# Patient Record
Sex: Male | Born: 1945
Health system: Southern US, Community
[De-identification: ages and names within clinical notes are randomized; demographics above are authoritative.]

## PROBLEM LIST (undated history)

## (undated) DIAGNOSIS — I110 Hypertensive heart disease with heart failure: Secondary | ICD-10-CM

## (undated) DIAGNOSIS — E785 Hyperlipidemia, unspecified: Secondary | ICD-10-CM

## (undated) DIAGNOSIS — I0981 Rheumatic heart failure: Secondary | ICD-10-CM

## (undated) DIAGNOSIS — G4730 Sleep apnea, unspecified: Secondary | ICD-10-CM

## (undated) DIAGNOSIS — I4819 Other persistent atrial fibrillation: Secondary | ICD-10-CM

## (undated) DIAGNOSIS — M1990 Unspecified osteoarthritis, unspecified site: Secondary | ICD-10-CM

## (undated) DIAGNOSIS — K56609 Unspecified intestinal obstruction, unspecified as to partial versus complete obstruction: Secondary | ICD-10-CM

## (undated) DIAGNOSIS — R739 Hyperglycemia, unspecified: Secondary | ICD-10-CM

## (undated) DIAGNOSIS — G4733 Obstructive sleep apnea (adult) (pediatric): Secondary | ICD-10-CM

## (undated) DIAGNOSIS — I059 Rheumatic mitral valve disease, unspecified: Secondary | ICD-10-CM

## (undated) DIAGNOSIS — R9439 Abnormal result of other cardiovascular function study: Secondary | ICD-10-CM

## (undated) DIAGNOSIS — I10 Essential (primary) hypertension: Secondary | ICD-10-CM

## (undated) DIAGNOSIS — Z79899 Other long term (current) drug therapy: Secondary | ICD-10-CM

## (undated) DIAGNOSIS — H269 Unspecified cataract: Secondary | ICD-10-CM

## (undated) DIAGNOSIS — I2510 Atherosclerotic heart disease of native coronary artery without angina pectoris: Secondary | ICD-10-CM

## (undated) DIAGNOSIS — Z7901 Long term (current) use of anticoagulants: Secondary | ICD-10-CM

## (undated) DIAGNOSIS — K219 Gastro-esophageal reflux disease without esophagitis: Secondary | ICD-10-CM

## (undated) DIAGNOSIS — I5032 Chronic diastolic (congestive) heart failure: Secondary | ICD-10-CM

## (undated) DIAGNOSIS — Z860100 Personal history of colon polyps, unspecified: Secondary | ICD-10-CM

## (undated) DIAGNOSIS — I517 Cardiomegaly: Secondary | ICD-10-CM

## (undated) HISTORY — PX: A FLUTTER ABLATION: SHX5348

## (undated) HISTORY — DX: Essential (primary) hypertension: I10

## (undated) HISTORY — DX: Rheumatic mitral valve disease, unspecified: I05.9

## (undated) HISTORY — PX: INGUINAL HERNIA REPAIR: SUR1180

## (undated) HISTORY — DX: Rheumatic heart failure: I09.81

## (undated) HISTORY — DX: Hyperglycemia, unspecified: R73.9

## (undated) HISTORY — PX: RECTAL SURGERY: SHX760

## (undated) HISTORY — DX: Obstructive sleep apnea (adult) (pediatric): G47.33

## (undated) HISTORY — DX: Hyperlipidemia, unspecified: E78.5

## (undated) HISTORY — DX: Unspecified osteoarthritis, unspecified site: M19.90

## (undated) HISTORY — PX: PROSTATE SURGERY: SHX751

## (undated) HISTORY — DX: Personal history of colon polyps, unspecified: Z86.0100

## (undated) HISTORY — DX: Hypertensive heart disease with heart failure: I11.0

## (undated) HISTORY — DX: Gilbert syndrome: E80.4

## (undated) HISTORY — PX: KNEE SURGERY: SHX244

## (undated) HISTORY — DX: Sleep apnea, unspecified: G47.30

## (undated) HISTORY — PX: CHOLECYSTECTOMY: SHX55

## (undated) HISTORY — DX: Other long term (current) drug therapy: Z79.899

## (undated) HISTORY — DX: Gastro-esophageal reflux disease without esophagitis: K21.9

## (undated) HISTORY — DX: Cardiomegaly: I51.7

## (undated) HISTORY — DX: Other persistent atrial fibrillation: I48.19

## (undated) HISTORY — PX: CORONARY ANGIOPLASTY WITH STENT PLACEMENT: SHX49

## (undated) HISTORY — DX: Abnormal result of other cardiovascular function study: R94.39

## (undated) HISTORY — DX: Long term (current) use of anticoagulants: Z79.01

## (undated) HISTORY — DX: Unspecified cataract: H26.9

## (undated) HISTORY — PX: HERNIA REPAIR: SHX51

## (undated) HISTORY — DX: Unspecified intestinal obstruction, unspecified as to partial versus complete obstruction: K56.609

## (undated) HISTORY — PX: CATARACT EXTRACTION: SUR2

## (undated) HISTORY — DX: Atherosclerotic heart disease of native coronary artery without angina pectoris: I25.10

## (undated) HISTORY — DX: Chronic diastolic (congestive) heart failure: I50.32

## (undated) HISTORY — DX: Personal history of colonic polyps: Z86.010

---

## 1988-12-09 DIAGNOSIS — I10 Essential (primary) hypertension: Secondary | ICD-10-CM | POA: Insufficient documentation

## 1988-12-09 HISTORY — DX: Essential (primary) hypertension: I10

## 2008-12-09 DIAGNOSIS — Z1211 Encounter for screening for malignant neoplasm of colon: Secondary | ICD-10-CM

## 2008-12-09 HISTORY — DX: Encounter for screening for malignant neoplasm of colon: Z12.11

## 2009-12-09 HISTORY — PX: CORONARY ARTERY BYPASS GRAFT: SHX141

## 2010-03-29 ENCOUNTER — Ambulatory Visit: Payer: Self-pay | Admitting: Cardiothoracic Surgery

## 2010-04-10 ENCOUNTER — Ambulatory Visit: Payer: Self-pay | Admitting: Cardiothoracic Surgery

## 2010-04-18 ENCOUNTER — Ambulatory Visit: Payer: Self-pay | Admitting: Cardiothoracic Surgery

## 2010-05-16 ENCOUNTER — Ambulatory Visit: Payer: Self-pay | Admitting: Cardiothoracic Surgery

## 2010-12-09 HISTORY — PX: CARDIAC SURGERY: SHX584

## 2011-04-23 NOTE — Assessment & Plan Note (Signed)
[facility] VISIT   Jason Stark, Jason Stark  DOB:  18-Sep-1946                                        May 16, 2010  CHART #:  [id number]   Jason Stark returned today for a scheduled follow-up after coronary  bypass grafting on April 03, 2010.  Jason Stark was seen in the office  on Apr 18, 2010, with complaint of drainage from the sternal incision  and lower extremity edema.  At that time, the sternum was stable and  there was no drainage or erythema noted along the sternal incision.  He  did have 3+ edema in the left leg extending up to the knee.  There was  some erythema near the ankle and tenderness in this region as well.  He  was asked to double his Lasix dose to 40 mg once daily and also increase  his potassium to 20 mEq daily.  Since that visit, he has continued to  make progress.  He denies any chest pain other than some persistent  soreness across his anterior chest wall.  He denies any shortness of  breath.  His last prothrombin time was 2.1 seconds obtained about 2  weeks ago.  On exam today, his heart remains in an irregularly irregular  rhythm, consistent with his known history of atrial fibrillation.  His  breath sounds are clear to auscultation.  The sternal incision is  healing nicely with no obvious complications.  The sternum is stable.  The edema in the left lower extremity has diminished significantly, but  he still has some 1+ edema extending up to the level of the mid calf.   IMPRESSION:  Continued progress after coronary bypass grafting and left-  sided Maze procedure on April 03, 2010, by Dr. Tyrone Sage.  I have asked  Jason Stark to reduce his Lasix dose back to 20 mg once daily and  potassium to 10 mEq once daily as he was taking prior to the last visit.  He has scheduled follow up with Dr. Tomie China next week at the [facility]  office of [facility].  The Coumadin is being  monitored by [facility] as well.   No further follow-up is  scheduled at this office, but I asked him to contact us if we could be  of any further assistance with his care.   Tera Mater. Arvilla Market, MD   MGR/MEDQ  D:  05/16/2010  T:  05/16/2010  Job:  [id number]

## 2011-04-23 NOTE — Assessment & Plan Note (Signed)
[facility] VISIT   EMEKA, LINDNER  DOB:  Nov 25, 1946                                        Apr 18, 2010  CHART #:  [id number]   SURGEON:  Sheliah Plane, MD.   HISTORY OF PRESENT ILLNESS:  Mr. Couper returned today at our request  for follow-up of his sternal incision and lower extremity edema.  He is  status post coronary bypass grafting and left-sided Maze procedure on  April 03, 2010.  He was seen in the office on Apr 10, 2010, for  evaluation of drainage of his lower sternal incision.  At that office  visit, he was also noted to have significant edema in the left lower  extremity.  He was started on Keflex 500 mg p.o. t.i.d. along with Lasix  20 mg p.o. daily and potassium chloride 20 mEq p.o. daily.  Since last  week, he believes he has continued to make steady improvement.  He says  he has not had any further drainage from the sternal incision.  He  continues to have some swelling of his left lower extremity, but he says  this is also slowly resolving.  He had inflammation of his left leg from  just below the knee down to the ankle for a period of several days, and  he says this too is resolving.  He denies having any fever.   PHYSICAL EXAMINATION:  Today, his heart is in an irregularly irregular  rhythm consistent with his history of atrial fibrillation.  The  ventricular rate seems to be well controlled at around 80 beats per  minute.  His breath sounds are clear to auscultation and are full and  equal.  Sternal incision is intact and healing satisfactorily with no  signs of infection.  There is no drainage and no erythema.  The sternum  is stable.  The left lower extremity continues to have 3+ edema  extending up to the level of the knee.  There is no inflammation around  the left knee incision which apparently was present at his last visit.  He has mild to erythema of his anterior lower leg in an area that would  measure approximately  10 x 10 cm just above the ankle.  He has mild  tenderness in this area.   LABORATORY DATA:  Mr. Cantara prothrombin time at the last visit was  16.9 seconds with an INR of 1.6.  He has been taking Coumadin 5 mg p.o.  daily.  He has not had any contact with the cardiology office since his  discharge home, but has a scheduled appointment with Dr. Dulce Sellar at the  [facility] office of [facility] tomorrow.   IMPRESSION:  Satisfactory progress following coronary bypass grafting.  There is still significant edema in the left lower extremity and some  mild erythema which is a little worrisome.  I have asked that he  continue with the oral Keflex for another week.  I have also increased  his Lasix to 40 mg p.o. daily and continue the potassium at 20 mEq p.o.  daily.  He is given a copy of his prothrombin time from last week which  he can deliver to Dr. Dulce Sellar at his visit tomorrow.  We will follow-up  with Mr. Arquette next week to determine whether or not he needs to  continue with Lasix and antibiotics.  His prothrombin time and Coumadin  dosage will be managed by [facility].   Leary Roca, P.A.   MGR/MEDQ  D:  04/18/2010  T:  04/18/2010  Job:  [id number]

## 2011-04-23 NOTE — Assessment & Plan Note (Signed)
[facility] VISIT   Jason Stark, Jason Stark  DOB:  07/11/1946                                        Apr 10, 2010  CHART #:  [id number]   Mr. Jason Stark had undergone coronary artery bypass grafting and left-  sided maze procedure on 04/03/2010.  He was discharged home earlier this  week.  He called the office and had a small amount of drainage from his  lower sternal incision, so was instructed to come to the office.  He has  had no fever, chills.  He has been walking every day since discharge.  He has had slight edema in his ankles.   PHYSICAL EXAMINATION:  On exam today he was afebrile.  Overall looked  well.  On examination of his sternum, he had just a very small amount of  spotting of serous fluid at the very lower pole of the sternal incision.  His chest tube sites were healed well.  His lungs were clear  bilaterally.  The left lower leg from the vein harvest site and the vein  harvest site was slightly bruised.  In the vein harvest incision at the  left knee, there was some slight erythema but no drainage.   The patient was discharged home on Coumadin.  At this point he appears  to still be in atrial fibrillation.  Since discharge he has not had a  prothrombin time checked.  Today I have written him a prescription to  start on Lasix 20 mg once a day, potassium 20 mEq once a day, each for 7  days and also start Keflex 500 mg q.8 h for 7 days.  He will stop in the  cardiologist's office today and have a prothrombin time checked to  evaluate his anticoagulation.  We will plan to see him back in 1 week.  If he has any further drainage or redness around his incision that does  not appear to be improving on current therapy, he will contact us  immediately.   Sheliah Plane, MD  Electronically Signed   EG/MEDQ  D:  04/10/2010  T:  04/10/2010  Job:  [id number]

## 2014-12-26 DIAGNOSIS — N529 Male erectile dysfunction, unspecified: Secondary | ICD-10-CM | POA: Diagnosis not present

## 2014-12-26 DIAGNOSIS — N318 Other neuromuscular dysfunction of bladder: Secondary | ICD-10-CM | POA: Diagnosis not present

## 2014-12-26 DIAGNOSIS — N3090 Cystitis, unspecified without hematuria: Secondary | ICD-10-CM | POA: Diagnosis not present

## 2014-12-26 DIAGNOSIS — N400 Enlarged prostate without lower urinary tract symptoms: Secondary | ICD-10-CM | POA: Diagnosis not present

## 2014-12-26 DIAGNOSIS — E291 Testicular hypofunction: Secondary | ICD-10-CM | POA: Diagnosis not present

## 2014-12-28 DIAGNOSIS — I5032 Chronic diastolic (congestive) heart failure: Secondary | ICD-10-CM | POA: Diagnosis not present

## 2014-12-28 DIAGNOSIS — Z79899 Other long term (current) drug therapy: Secondary | ICD-10-CM | POA: Diagnosis not present

## 2014-12-28 DIAGNOSIS — I480 Paroxysmal atrial fibrillation: Secondary | ICD-10-CM | POA: Diagnosis not present

## 2014-12-28 DIAGNOSIS — Z951 Presence of aortocoronary bypass graft: Secondary | ICD-10-CM | POA: Diagnosis not present

## 2014-12-28 DIAGNOSIS — I110 Hypertensive heart disease with heart failure: Secondary | ICD-10-CM | POA: Diagnosis not present

## 2014-12-28 DIAGNOSIS — E785 Hyperlipidemia, unspecified: Secondary | ICD-10-CM | POA: Diagnosis not present

## 2015-02-10 DIAGNOSIS — G4733 Obstructive sleep apnea (adult) (pediatric): Secondary | ICD-10-CM | POA: Diagnosis not present

## 2015-02-10 DIAGNOSIS — J4520 Mild intermittent asthma, uncomplicated: Secondary | ICD-10-CM | POA: Diagnosis not present

## 2015-02-10 DIAGNOSIS — J301 Allergic rhinitis due to pollen: Secondary | ICD-10-CM | POA: Diagnosis not present

## 2015-02-24 DIAGNOSIS — M1990 Unspecified osteoarthritis, unspecified site: Secondary | ICD-10-CM | POA: Diagnosis not present

## 2015-02-24 DIAGNOSIS — Z1211 Encounter for screening for malignant neoplasm of colon: Secondary | ICD-10-CM | POA: Diagnosis not present

## 2015-02-24 DIAGNOSIS — E559 Vitamin D deficiency, unspecified: Secondary | ICD-10-CM | POA: Diagnosis not present

## 2015-02-24 DIAGNOSIS — I110 Hypertensive heart disease with heart failure: Secondary | ICD-10-CM | POA: Diagnosis not present

## 2015-02-24 DIAGNOSIS — I509 Heart failure, unspecified: Secondary | ICD-10-CM | POA: Diagnosis not present

## 2015-02-24 DIAGNOSIS — E785 Hyperlipidemia, unspecified: Secondary | ICD-10-CM | POA: Diagnosis not present

## 2015-02-24 DIAGNOSIS — K210 Gastro-esophageal reflux disease with esophagitis: Secondary | ICD-10-CM | POA: Diagnosis not present

## 2015-02-24 DIAGNOSIS — R7309 Other abnormal glucose: Secondary | ICD-10-CM | POA: Diagnosis not present

## 2015-02-24 DIAGNOSIS — Z79899 Other long term (current) drug therapy: Secondary | ICD-10-CM | POA: Diagnosis not present

## 2015-02-24 DIAGNOSIS — Z136 Encounter for screening for cardiovascular disorders: Secondary | ICD-10-CM | POA: Diagnosis not present

## 2015-02-24 DIAGNOSIS — Z0000 Encounter for general adult medical examination without abnormal findings: Secondary | ICD-10-CM | POA: Diagnosis not present

## 2015-03-06 DIAGNOSIS — Z1211 Encounter for screening for malignant neoplasm of colon: Secondary | ICD-10-CM | POA: Diagnosis not present

## 2015-03-27 DIAGNOSIS — R339 Retention of urine, unspecified: Secondary | ICD-10-CM | POA: Diagnosis not present

## 2015-03-27 DIAGNOSIS — N401 Enlarged prostate with lower urinary tract symptoms: Secondary | ICD-10-CM | POA: Diagnosis not present

## 2015-03-27 DIAGNOSIS — R351 Nocturia: Secondary | ICD-10-CM | POA: Diagnosis not present

## 2015-03-27 DIAGNOSIS — E291 Testicular hypofunction: Secondary | ICD-10-CM | POA: Diagnosis not present

## 2015-03-27 DIAGNOSIS — N318 Other neuromuscular dysfunction of bladder: Secondary | ICD-10-CM | POA: Diagnosis not present

## 2015-03-27 DIAGNOSIS — N529 Male erectile dysfunction, unspecified: Secondary | ICD-10-CM | POA: Diagnosis not present

## 2015-03-27 DIAGNOSIS — N3090 Cystitis, unspecified without hematuria: Secondary | ICD-10-CM | POA: Diagnosis not present

## 2015-06-02 DIAGNOSIS — J45909 Unspecified asthma, uncomplicated: Secondary | ICD-10-CM | POA: Diagnosis not present

## 2015-06-02 DIAGNOSIS — J309 Allergic rhinitis, unspecified: Secondary | ICD-10-CM | POA: Diagnosis not present

## 2015-06-02 DIAGNOSIS — R05 Cough: Secondary | ICD-10-CM | POA: Diagnosis not present

## 2015-06-10 DIAGNOSIS — H6123 Impacted cerumen, bilateral: Secondary | ICD-10-CM | POA: Diagnosis not present

## 2015-06-10 DIAGNOSIS — J069 Acute upper respiratory infection, unspecified: Secondary | ICD-10-CM | POA: Diagnosis not present

## 2015-06-13 DIAGNOSIS — I4821 Permanent atrial fibrillation: Secondary | ICD-10-CM

## 2015-06-13 DIAGNOSIS — Z79899 Other long term (current) drug therapy: Secondary | ICD-10-CM | POA: Insufficient documentation

## 2015-06-13 DIAGNOSIS — I110 Hypertensive heart disease with heart failure: Secondary | ICD-10-CM | POA: Insufficient documentation

## 2015-06-13 DIAGNOSIS — I2510 Atherosclerotic heart disease of native coronary artery without angina pectoris: Secondary | ICD-10-CM

## 2015-06-13 DIAGNOSIS — E785 Hyperlipidemia, unspecified: Secondary | ICD-10-CM | POA: Insufficient documentation

## 2015-06-13 DIAGNOSIS — I5032 Chronic diastolic (congestive) heart failure: Secondary | ICD-10-CM

## 2015-06-13 DIAGNOSIS — I4819 Other persistent atrial fibrillation: Secondary | ICD-10-CM

## 2015-06-13 HISTORY — DX: Hyperlipidemia, unspecified: E78.5

## 2015-06-13 HISTORY — DX: Permanent atrial fibrillation: I48.21

## 2015-06-13 HISTORY — DX: Atherosclerotic heart disease of native coronary artery without angina pectoris: I25.10

## 2015-06-13 HISTORY — DX: Other long term (current) drug therapy: Z79.899

## 2015-06-13 HISTORY — DX: Hypertensive heart disease with heart failure: I11.0

## 2015-06-13 HISTORY — DX: Other persistent atrial fibrillation: I48.19

## 2015-06-13 HISTORY — DX: Chronic diastolic (congestive) heart failure: I50.32

## 2015-06-14 DIAGNOSIS — I2510 Atherosclerotic heart disease of native coronary artery without angina pectoris: Secondary | ICD-10-CM | POA: Diagnosis not present

## 2015-06-14 DIAGNOSIS — I480 Paroxysmal atrial fibrillation: Secondary | ICD-10-CM | POA: Diagnosis not present

## 2015-06-14 DIAGNOSIS — I110 Hypertensive heart disease with heart failure: Secondary | ICD-10-CM | POA: Diagnosis not present

## 2015-06-14 DIAGNOSIS — I5032 Chronic diastolic (congestive) heart failure: Secondary | ICD-10-CM | POA: Diagnosis not present

## 2015-06-16 DIAGNOSIS — J301 Allergic rhinitis due to pollen: Secondary | ICD-10-CM | POA: Diagnosis not present

## 2015-06-16 DIAGNOSIS — J4520 Mild intermittent asthma, uncomplicated: Secondary | ICD-10-CM | POA: Diagnosis not present

## 2015-06-16 DIAGNOSIS — G4733 Obstructive sleep apnea (adult) (pediatric): Secondary | ICD-10-CM | POA: Diagnosis not present

## 2015-06-21 DIAGNOSIS — J309 Allergic rhinitis, unspecified: Secondary | ICD-10-CM | POA: Diagnosis not present

## 2015-06-21 DIAGNOSIS — J45909 Unspecified asthma, uncomplicated: Secondary | ICD-10-CM | POA: Diagnosis not present

## 2015-06-21 DIAGNOSIS — R05 Cough: Secondary | ICD-10-CM | POA: Diagnosis not present

## 2015-06-26 DIAGNOSIS — N3090 Cystitis, unspecified without hematuria: Secondary | ICD-10-CM | POA: Diagnosis not present

## 2015-06-26 DIAGNOSIS — N400 Enlarged prostate without lower urinary tract symptoms: Secondary | ICD-10-CM | POA: Diagnosis not present

## 2015-06-26 DIAGNOSIS — E291 Testicular hypofunction: Secondary | ICD-10-CM | POA: Diagnosis not present

## 2015-06-26 DIAGNOSIS — N529 Male erectile dysfunction, unspecified: Secondary | ICD-10-CM | POA: Diagnosis not present

## 2015-06-26 DIAGNOSIS — R5383 Other fatigue: Secondary | ICD-10-CM | POA: Diagnosis not present

## 2015-06-30 DIAGNOSIS — I110 Hypertensive heart disease with heart failure: Secondary | ICD-10-CM | POA: Diagnosis not present

## 2015-06-30 DIAGNOSIS — R7309 Other abnormal glucose: Secondary | ICD-10-CM | POA: Diagnosis not present

## 2015-06-30 DIAGNOSIS — M1990 Unspecified osteoarthritis, unspecified site: Secondary | ICD-10-CM | POA: Diagnosis not present

## 2015-06-30 DIAGNOSIS — K210 Gastro-esophageal reflux disease with esophagitis: Secondary | ICD-10-CM | POA: Diagnosis not present

## 2015-06-30 DIAGNOSIS — E291 Testicular hypofunction: Secondary | ICD-10-CM | POA: Diagnosis not present

## 2015-06-30 DIAGNOSIS — Z79899 Other long term (current) drug therapy: Secondary | ICD-10-CM | POA: Diagnosis not present

## 2015-06-30 DIAGNOSIS — I509 Heart failure, unspecified: Secondary | ICD-10-CM | POA: Diagnosis not present

## 2015-06-30 DIAGNOSIS — E785 Hyperlipidemia, unspecified: Secondary | ICD-10-CM | POA: Diagnosis not present

## 2015-06-30 DIAGNOSIS — E559 Vitamin D deficiency, unspecified: Secondary | ICD-10-CM | POA: Diagnosis not present

## 2015-07-12 DIAGNOSIS — E876 Hypokalemia: Secondary | ICD-10-CM | POA: Diagnosis not present

## 2015-07-12 DIAGNOSIS — J45909 Unspecified asthma, uncomplicated: Secondary | ICD-10-CM | POA: Diagnosis not present

## 2015-07-12 DIAGNOSIS — R05 Cough: Secondary | ICD-10-CM | POA: Diagnosis not present

## 2015-07-28 DIAGNOSIS — J301 Allergic rhinitis due to pollen: Secondary | ICD-10-CM | POA: Diagnosis not present

## 2015-07-28 DIAGNOSIS — J4520 Mild intermittent asthma, uncomplicated: Secondary | ICD-10-CM | POA: Diagnosis not present

## 2015-07-28 DIAGNOSIS — G4733 Obstructive sleep apnea (adult) (pediatric): Secondary | ICD-10-CM | POA: Diagnosis not present

## 2015-09-25 DIAGNOSIS — N3020 Other chronic cystitis without hematuria: Secondary | ICD-10-CM | POA: Diagnosis not present

## 2015-09-25 DIAGNOSIS — N529 Male erectile dysfunction, unspecified: Secondary | ICD-10-CM | POA: Diagnosis not present

## 2015-09-25 DIAGNOSIS — E291 Testicular hypofunction: Secondary | ICD-10-CM | POA: Diagnosis not present

## 2015-09-25 DIAGNOSIS — N401 Enlarged prostate with lower urinary tract symptoms: Secondary | ICD-10-CM | POA: Diagnosis not present

## 2015-10-04 DIAGNOSIS — H2513 Age-related nuclear cataract, bilateral: Secondary | ICD-10-CM | POA: Diagnosis not present

## 2015-10-04 DIAGNOSIS — H04129 Dry eye syndrome of unspecified lacrimal gland: Secondary | ICD-10-CM | POA: Diagnosis not present

## 2015-10-07 DIAGNOSIS — K529 Noninfective gastroenteritis and colitis, unspecified: Secondary | ICD-10-CM | POA: Diagnosis not present

## 2015-10-12 DIAGNOSIS — A084 Viral intestinal infection, unspecified: Secondary | ICD-10-CM | POA: Diagnosis not present

## 2015-10-20 DIAGNOSIS — J301 Allergic rhinitis due to pollen: Secondary | ICD-10-CM | POA: Diagnosis not present

## 2015-10-20 DIAGNOSIS — J4520 Mild intermittent asthma, uncomplicated: Secondary | ICD-10-CM | POA: Diagnosis not present

## 2015-10-20 DIAGNOSIS — G4733 Obstructive sleep apnea (adult) (pediatric): Secondary | ICD-10-CM | POA: Diagnosis not present

## 2015-11-09 DIAGNOSIS — I110 Hypertensive heart disease with heart failure: Secondary | ICD-10-CM | POA: Diagnosis not present

## 2015-11-09 DIAGNOSIS — I5032 Chronic diastolic (congestive) heart failure: Secondary | ICD-10-CM | POA: Diagnosis not present

## 2015-11-09 DIAGNOSIS — E785 Hyperlipidemia, unspecified: Secondary | ICD-10-CM | POA: Diagnosis not present

## 2015-11-09 DIAGNOSIS — R7303 Prediabetes: Secondary | ICD-10-CM | POA: Diagnosis not present

## 2015-11-09 DIAGNOSIS — Z79899 Other long term (current) drug therapy: Secondary | ICD-10-CM | POA: Diagnosis not present

## 2015-11-09 DIAGNOSIS — K210 Gastro-esophageal reflux disease with esophagitis: Secondary | ICD-10-CM | POA: Diagnosis not present

## 2015-11-09 DIAGNOSIS — M25561 Pain in right knee: Secondary | ICD-10-CM | POA: Diagnosis not present

## 2015-11-09 DIAGNOSIS — I509 Heart failure, unspecified: Secondary | ICD-10-CM | POA: Diagnosis not present

## 2015-11-09 DIAGNOSIS — E291 Testicular hypofunction: Secondary | ICD-10-CM | POA: Diagnosis not present

## 2015-11-09 DIAGNOSIS — I130 Hypertensive heart and chronic kidney disease with heart failure and stage 1 through stage 4 chronic kidney disease, or unspecified chronic kidney disease: Secondary | ICD-10-CM | POA: Diagnosis not present

## 2015-11-09 DIAGNOSIS — E559 Vitamin D deficiency, unspecified: Secondary | ICD-10-CM | POA: Diagnosis not present

## 2015-11-09 DIAGNOSIS — M25562 Pain in left knee: Secondary | ICD-10-CM | POA: Diagnosis not present

## 2015-11-09 DIAGNOSIS — N182 Chronic kidney disease, stage 2 (mild): Secondary | ICD-10-CM | POA: Diagnosis not present

## 2015-12-12 DIAGNOSIS — Z79899 Other long term (current) drug therapy: Secondary | ICD-10-CM | POA: Diagnosis not present

## 2015-12-12 DIAGNOSIS — I480 Paroxysmal atrial fibrillation: Secondary | ICD-10-CM | POA: Diagnosis not present

## 2015-12-12 DIAGNOSIS — I5032 Chronic diastolic (congestive) heart failure: Secondary | ICD-10-CM | POA: Diagnosis not present

## 2015-12-12 DIAGNOSIS — I110 Hypertensive heart disease with heart failure: Secondary | ICD-10-CM | POA: Diagnosis not present

## 2015-12-14 DIAGNOSIS — R05 Cough: Secondary | ICD-10-CM | POA: Diagnosis not present

## 2015-12-14 DIAGNOSIS — J309 Allergic rhinitis, unspecified: Secondary | ICD-10-CM | POA: Diagnosis not present

## 2015-12-19 DIAGNOSIS — R05 Cough: Secondary | ICD-10-CM | POA: Diagnosis not present

## 2015-12-19 DIAGNOSIS — J181 Lobar pneumonia, unspecified organism: Secondary | ICD-10-CM | POA: Diagnosis not present

## 2015-12-19 DIAGNOSIS — I480 Paroxysmal atrial fibrillation: Secondary | ICD-10-CM | POA: Diagnosis not present

## 2015-12-19 DIAGNOSIS — Z7901 Long term (current) use of anticoagulants: Secondary | ICD-10-CM | POA: Insufficient documentation

## 2015-12-19 HISTORY — DX: Long term (current) use of anticoagulants: Z79.01

## 2015-12-20 DIAGNOSIS — Z7901 Long term (current) use of anticoagulants: Secondary | ICD-10-CM | POA: Diagnosis not present

## 2015-12-20 DIAGNOSIS — I480 Paroxysmal atrial fibrillation: Secondary | ICD-10-CM | POA: Diagnosis not present

## 2015-12-28 DIAGNOSIS — J069 Acute upper respiratory infection, unspecified: Secondary | ICD-10-CM | POA: Diagnosis not present

## 2015-12-28 DIAGNOSIS — R0602 Shortness of breath: Secondary | ICD-10-CM | POA: Diagnosis not present

## 2015-12-28 DIAGNOSIS — R05 Cough: Secondary | ICD-10-CM | POA: Diagnosis not present

## 2016-01-01 DIAGNOSIS — E291 Testicular hypofunction: Secondary | ICD-10-CM | POA: Diagnosis not present

## 2016-01-01 DIAGNOSIS — R351 Nocturia: Secondary | ICD-10-CM | POA: Diagnosis not present

## 2016-01-01 DIAGNOSIS — N401 Enlarged prostate with lower urinary tract symptoms: Secondary | ICD-10-CM | POA: Diagnosis not present

## 2016-01-01 DIAGNOSIS — N3020 Other chronic cystitis without hematuria: Secondary | ICD-10-CM | POA: Diagnosis not present

## 2016-01-03 DIAGNOSIS — Z862 Personal history of diseases of the blood and blood-forming organs and certain disorders involving the immune mechanism: Secondary | ICD-10-CM | POA: Diagnosis not present

## 2016-01-03 DIAGNOSIS — Z9189 Other specified personal risk factors, not elsewhere classified: Secondary | ICD-10-CM | POA: Diagnosis not present

## 2016-01-03 DIAGNOSIS — R05 Cough: Secondary | ICD-10-CM | POA: Diagnosis not present

## 2016-01-03 DIAGNOSIS — Z8701 Personal history of pneumonia (recurrent): Secondary | ICD-10-CM | POA: Diagnosis not present

## 2016-01-19 DIAGNOSIS — I480 Paroxysmal atrial fibrillation: Secondary | ICD-10-CM | POA: Diagnosis not present

## 2016-01-19 DIAGNOSIS — I129 Hypertensive chronic kidney disease with stage 1 through stage 4 chronic kidney disease, or unspecified chronic kidney disease: Secondary | ICD-10-CM | POA: Diagnosis not present

## 2016-01-19 DIAGNOSIS — N182 Chronic kidney disease, stage 2 (mild): Secondary | ICD-10-CM | POA: Diagnosis not present

## 2016-01-19 DIAGNOSIS — I509 Heart failure, unspecified: Secondary | ICD-10-CM | POA: Diagnosis not present

## 2016-01-19 DIAGNOSIS — E785 Hyperlipidemia, unspecified: Secondary | ICD-10-CM | POA: Diagnosis not present

## 2016-01-19 DIAGNOSIS — Z951 Presence of aortocoronary bypass graft: Secondary | ICD-10-CM | POA: Diagnosis not present

## 2016-01-19 DIAGNOSIS — J45909 Unspecified asthma, uncomplicated: Secondary | ICD-10-CM | POA: Diagnosis not present

## 2016-01-19 DIAGNOSIS — I2510 Atherosclerotic heart disease of native coronary artery without angina pectoris: Secondary | ICD-10-CM | POA: Diagnosis not present

## 2016-01-19 DIAGNOSIS — Z87891 Personal history of nicotine dependence: Secondary | ICD-10-CM | POA: Diagnosis not present

## 2016-01-19 DIAGNOSIS — I5030 Unspecified diastolic (congestive) heart failure: Secondary | ICD-10-CM | POA: Diagnosis not present

## 2016-01-19 DIAGNOSIS — K219 Gastro-esophageal reflux disease without esophagitis: Secondary | ICD-10-CM | POA: Diagnosis not present

## 2016-01-19 DIAGNOSIS — Z79899 Other long term (current) drug therapy: Secondary | ICD-10-CM | POA: Diagnosis not present

## 2016-01-19 DIAGNOSIS — G4730 Sleep apnea, unspecified: Secondary | ICD-10-CM | POA: Diagnosis not present

## 2016-01-19 DIAGNOSIS — I4891 Unspecified atrial fibrillation: Secondary | ICD-10-CM | POA: Diagnosis not present

## 2016-01-19 DIAGNOSIS — J449 Chronic obstructive pulmonary disease, unspecified: Secondary | ICD-10-CM | POA: Diagnosis not present

## 2016-01-19 HISTORY — PX: CARDIOVERSION: SHX1299

## 2016-01-25 DIAGNOSIS — I4891 Unspecified atrial fibrillation: Secondary | ICD-10-CM | POA: Diagnosis not present

## 2016-01-29 DIAGNOSIS — R05 Cough: Secondary | ICD-10-CM | POA: Diagnosis not present

## 2016-02-07 DIAGNOSIS — R079 Chest pain, unspecified: Secondary | ICD-10-CM | POA: Diagnosis not present

## 2016-02-07 DIAGNOSIS — I480 Paroxysmal atrial fibrillation: Secondary | ICD-10-CM | POA: Diagnosis not present

## 2016-02-21 DIAGNOSIS — R079 Chest pain, unspecified: Secondary | ICD-10-CM | POA: Diagnosis not present

## 2016-02-23 DIAGNOSIS — J4520 Mild intermittent asthma, uncomplicated: Secondary | ICD-10-CM | POA: Diagnosis not present

## 2016-02-23 DIAGNOSIS — J301 Allergic rhinitis due to pollen: Secondary | ICD-10-CM | POA: Diagnosis not present

## 2016-02-23 DIAGNOSIS — G4733 Obstructive sleep apnea (adult) (pediatric): Secondary | ICD-10-CM | POA: Diagnosis not present

## 2016-02-28 DIAGNOSIS — I130 Hypertensive heart and chronic kidney disease with heart failure and stage 1 through stage 4 chronic kidney disease, or unspecified chronic kidney disease: Secondary | ICD-10-CM | POA: Diagnosis not present

## 2016-02-28 DIAGNOSIS — Z79899 Other long term (current) drug therapy: Secondary | ICD-10-CM | POA: Diagnosis not present

## 2016-02-28 DIAGNOSIS — Z6831 Body mass index (BMI) 31.0-31.9, adult: Secondary | ICD-10-CM | POA: Diagnosis not present

## 2016-02-28 DIAGNOSIS — Z1211 Encounter for screening for malignant neoplasm of colon: Secondary | ICD-10-CM | POA: Diagnosis not present

## 2016-02-28 DIAGNOSIS — E785 Hyperlipidemia, unspecified: Secondary | ICD-10-CM | POA: Diagnosis not present

## 2016-02-28 DIAGNOSIS — Z136 Encounter for screening for cardiovascular disorders: Secondary | ICD-10-CM | POA: Diagnosis not present

## 2016-02-28 DIAGNOSIS — R7303 Prediabetes: Secondary | ICD-10-CM | POA: Diagnosis not present

## 2016-02-28 DIAGNOSIS — K219 Gastro-esophageal reflux disease without esophagitis: Secondary | ICD-10-CM | POA: Diagnosis not present

## 2016-02-28 DIAGNOSIS — E669 Obesity, unspecified: Secondary | ICD-10-CM | POA: Diagnosis not present

## 2016-02-28 DIAGNOSIS — Z9181 History of falling: Secondary | ICD-10-CM | POA: Diagnosis not present

## 2016-02-28 DIAGNOSIS — N182 Chronic kidney disease, stage 2 (mild): Secondary | ICD-10-CM | POA: Diagnosis not present

## 2016-02-28 DIAGNOSIS — Z0000 Encounter for general adult medical examination without abnormal findings: Secondary | ICD-10-CM | POA: Diagnosis not present

## 2016-02-28 DIAGNOSIS — E559 Vitamin D deficiency, unspecified: Secondary | ICD-10-CM | POA: Diagnosis not present

## 2016-02-28 DIAGNOSIS — Z1389 Encounter for screening for other disorder: Secondary | ICD-10-CM | POA: Diagnosis not present

## 2016-03-05 DIAGNOSIS — R079 Chest pain, unspecified: Secondary | ICD-10-CM | POA: Diagnosis not present

## 2016-03-05 DIAGNOSIS — I259 Chronic ischemic heart disease, unspecified: Secondary | ICD-10-CM | POA: Diagnosis not present

## 2016-03-07 DIAGNOSIS — I10 Essential (primary) hypertension: Secondary | ICD-10-CM | POA: Diagnosis not present

## 2016-03-07 DIAGNOSIS — H6120 Impacted cerumen, unspecified ear: Secondary | ICD-10-CM | POA: Diagnosis not present

## 2016-03-09 HISTORY — PX: CORONARY STENT PLACEMENT: SHX1402

## 2016-03-11 DIAGNOSIS — R9439 Abnormal result of other cardiovascular function study: Secondary | ICD-10-CM | POA: Insufficient documentation

## 2016-03-11 HISTORY — DX: Abnormal result of other cardiovascular function study: R94.39

## 2016-03-13 DIAGNOSIS — I517 Cardiomegaly: Secondary | ICD-10-CM | POA: Diagnosis not present

## 2016-03-13 DIAGNOSIS — R9439 Abnormal result of other cardiovascular function study: Secondary | ICD-10-CM | POA: Diagnosis not present

## 2016-03-13 DIAGNOSIS — I110 Hypertensive heart disease with heart failure: Secondary | ICD-10-CM | POA: Diagnosis not present

## 2016-03-13 DIAGNOSIS — I480 Paroxysmal atrial fibrillation: Secondary | ICD-10-CM | POA: Diagnosis not present

## 2016-03-13 DIAGNOSIS — I25119 Atherosclerotic heart disease of native coronary artery with unspecified angina pectoris: Secondary | ICD-10-CM | POA: Diagnosis not present

## 2016-03-13 DIAGNOSIS — Z951 Presence of aortocoronary bypass graft: Secondary | ICD-10-CM | POA: Diagnosis not present

## 2016-03-13 DIAGNOSIS — I10 Essential (primary) hypertension: Secondary | ICD-10-CM | POA: Diagnosis not present

## 2016-03-13 DIAGNOSIS — I5032 Chronic diastolic (congestive) heart failure: Secondary | ICD-10-CM | POA: Diagnosis not present

## 2016-03-21 DIAGNOSIS — R9439 Abnormal result of other cardiovascular function study: Secondary | ICD-10-CM

## 2016-03-21 DIAGNOSIS — I110 Hypertensive heart disease with heart failure: Secondary | ICD-10-CM | POA: Diagnosis not present

## 2016-03-21 DIAGNOSIS — R739 Hyperglycemia, unspecified: Secondary | ICD-10-CM | POA: Diagnosis not present

## 2016-03-21 DIAGNOSIS — I25119 Atherosclerotic heart disease of native coronary artery with unspecified angina pectoris: Secondary | ICD-10-CM | POA: Diagnosis not present

## 2016-03-21 DIAGNOSIS — Z87891 Personal history of nicotine dependence: Secondary | ICD-10-CM | POA: Diagnosis not present

## 2016-03-21 DIAGNOSIS — I4510 Unspecified right bundle-branch block: Secondary | ICD-10-CM | POA: Diagnosis not present

## 2016-03-21 DIAGNOSIS — Z9049 Acquired absence of other specified parts of digestive tract: Secondary | ICD-10-CM | POA: Diagnosis not present

## 2016-03-21 DIAGNOSIS — Z79899 Other long term (current) drug therapy: Secondary | ICD-10-CM | POA: Diagnosis not present

## 2016-03-21 DIAGNOSIS — I059 Rheumatic mitral valve disease, unspecified: Secondary | ICD-10-CM | POA: Diagnosis not present

## 2016-03-21 DIAGNOSIS — Z951 Presence of aortocoronary bypass graft: Secondary | ICD-10-CM | POA: Diagnosis not present

## 2016-03-21 DIAGNOSIS — E785 Hyperlipidemia, unspecified: Secondary | ICD-10-CM | POA: Diagnosis not present

## 2016-03-21 DIAGNOSIS — I4891 Unspecified atrial fibrillation: Secondary | ICD-10-CM | POA: Diagnosis not present

## 2016-03-21 DIAGNOSIS — I5032 Chronic diastolic (congestive) heart failure: Secondary | ICD-10-CM | POA: Diagnosis not present

## 2016-03-21 DIAGNOSIS — I25118 Atherosclerotic heart disease of native coronary artery with other forms of angina pectoris: Secondary | ICD-10-CM | POA: Diagnosis not present

## 2016-03-21 DIAGNOSIS — G4733 Obstructive sleep apnea (adult) (pediatric): Secondary | ICD-10-CM | POA: Diagnosis not present

## 2016-03-21 DIAGNOSIS — I481 Persistent atrial fibrillation: Secondary | ICD-10-CM | POA: Diagnosis not present

## 2016-03-21 DIAGNOSIS — I2119 ST elevation (STEMI) myocardial infarction involving other coronary artery of inferior wall: Secondary | ICD-10-CM | POA: Diagnosis not present

## 2016-03-21 DIAGNOSIS — Z9889 Other specified postprocedural states: Secondary | ICD-10-CM | POA: Diagnosis not present

## 2016-03-21 HISTORY — DX: Abnormal result of other cardiovascular function study: R94.39

## 2016-03-22 DIAGNOSIS — I4891 Unspecified atrial fibrillation: Secondary | ICD-10-CM | POA: Diagnosis not present

## 2016-03-22 DIAGNOSIS — I25119 Atherosclerotic heart disease of native coronary artery with unspecified angina pectoris: Secondary | ICD-10-CM | POA: Diagnosis not present

## 2016-03-22 DIAGNOSIS — G4733 Obstructive sleep apnea (adult) (pediatric): Secondary | ICD-10-CM | POA: Diagnosis not present

## 2016-03-22 DIAGNOSIS — I509 Heart failure, unspecified: Secondary | ICD-10-CM | POA: Diagnosis not present

## 2016-03-22 DIAGNOSIS — I481 Persistent atrial fibrillation: Secondary | ICD-10-CM | POA: Diagnosis not present

## 2016-03-22 DIAGNOSIS — I059 Rheumatic mitral valve disease, unspecified: Secondary | ICD-10-CM | POA: Diagnosis not present

## 2016-03-22 DIAGNOSIS — I2510 Atherosclerotic heart disease of native coronary artery without angina pectoris: Secondary | ICD-10-CM | POA: Diagnosis not present

## 2016-03-22 DIAGNOSIS — I252 Old myocardial infarction: Secondary | ICD-10-CM | POA: Diagnosis not present

## 2016-03-22 DIAGNOSIS — R9430 Abnormal result of cardiovascular function study, unspecified: Secondary | ICD-10-CM | POA: Diagnosis not present

## 2016-03-22 DIAGNOSIS — I10 Essential (primary) hypertension: Secondary | ICD-10-CM | POA: Diagnosis not present

## 2016-03-22 DIAGNOSIS — I4510 Unspecified right bundle-branch block: Secondary | ICD-10-CM | POA: Diagnosis not present

## 2016-03-22 DIAGNOSIS — R9439 Abnormal result of other cardiovascular function study: Secondary | ICD-10-CM | POA: Diagnosis not present

## 2016-03-22 DIAGNOSIS — Z951 Presence of aortocoronary bypass graft: Secondary | ICD-10-CM | POA: Diagnosis not present

## 2016-03-22 DIAGNOSIS — Z955 Presence of coronary angioplasty implant and graft: Secondary | ICD-10-CM | POA: Diagnosis not present

## 2016-04-02 DIAGNOSIS — R351 Nocturia: Secondary | ICD-10-CM | POA: Diagnosis not present

## 2016-04-02 DIAGNOSIS — J449 Chronic obstructive pulmonary disease, unspecified: Secondary | ICD-10-CM | POA: Diagnosis not present

## 2016-04-02 DIAGNOSIS — N182 Chronic kidney disease, stage 2 (mild): Secondary | ICD-10-CM | POA: Diagnosis not present

## 2016-04-02 DIAGNOSIS — I130 Hypertensive heart and chronic kidney disease with heart failure and stage 1 through stage 4 chronic kidney disease, or unspecified chronic kidney disease: Secondary | ICD-10-CM | POA: Diagnosis not present

## 2016-04-02 DIAGNOSIS — N401 Enlarged prostate with lower urinary tract symptoms: Secondary | ICD-10-CM | POA: Diagnosis not present

## 2016-04-02 DIAGNOSIS — Z1389 Encounter for screening for other disorder: Secondary | ICD-10-CM | POA: Diagnosis not present

## 2016-04-02 DIAGNOSIS — E7800 Pure hypercholesterolemia, unspecified: Secondary | ICD-10-CM | POA: Diagnosis not present

## 2016-04-02 DIAGNOSIS — I25810 Atherosclerosis of coronary artery bypass graft(s) without angina pectoris: Secondary | ICD-10-CM | POA: Diagnosis not present

## 2016-04-02 DIAGNOSIS — E291 Testicular hypofunction: Secondary | ICD-10-CM | POA: Diagnosis not present

## 2016-04-02 DIAGNOSIS — N3020 Other chronic cystitis without hematuria: Secondary | ICD-10-CM | POA: Diagnosis not present

## 2016-04-02 DIAGNOSIS — I480 Paroxysmal atrial fibrillation: Secondary | ICD-10-CM | POA: Diagnosis not present

## 2016-04-02 DIAGNOSIS — Z9189 Other specified personal risk factors, not elsewhere classified: Secondary | ICD-10-CM | POA: Diagnosis not present

## 2016-04-17 DIAGNOSIS — E785 Hyperlipidemia, unspecified: Secondary | ICD-10-CM | POA: Diagnosis not present

## 2016-04-17 DIAGNOSIS — I110 Hypertensive heart disease with heart failure: Secondary | ICD-10-CM | POA: Diagnosis not present

## 2016-04-17 DIAGNOSIS — I5032 Chronic diastolic (congestive) heart failure: Secondary | ICD-10-CM | POA: Diagnosis not present

## 2016-04-17 DIAGNOSIS — R9439 Abnormal result of other cardiovascular function study: Secondary | ICD-10-CM | POA: Diagnosis not present

## 2016-04-17 DIAGNOSIS — I25119 Atherosclerotic heart disease of native coronary artery with unspecified angina pectoris: Secondary | ICD-10-CM | POA: Diagnosis not present

## 2016-04-17 DIAGNOSIS — I481 Persistent atrial fibrillation: Secondary | ICD-10-CM | POA: Diagnosis not present

## 2016-04-25 DIAGNOSIS — R195 Other fecal abnormalities: Secondary | ICD-10-CM | POA: Diagnosis not present

## 2016-05-09 DIAGNOSIS — I2510 Atherosclerotic heart disease of native coronary artery without angina pectoris: Secondary | ICD-10-CM | POA: Diagnosis not present

## 2016-05-09 DIAGNOSIS — J449 Chronic obstructive pulmonary disease, unspecified: Secondary | ICD-10-CM | POA: Diagnosis not present

## 2016-05-09 DIAGNOSIS — R195 Other fecal abnormalities: Secondary | ICD-10-CM | POA: Diagnosis not present

## 2016-05-28 DIAGNOSIS — R195 Other fecal abnormalities: Secondary | ICD-10-CM | POA: Diagnosis not present

## 2016-05-28 DIAGNOSIS — I2510 Atherosclerotic heart disease of native coronary artery without angina pectoris: Secondary | ICD-10-CM | POA: Diagnosis not present

## 2016-06-21 DIAGNOSIS — J301 Allergic rhinitis due to pollen: Secondary | ICD-10-CM | POA: Diagnosis not present

## 2016-06-21 DIAGNOSIS — J4520 Mild intermittent asthma, uncomplicated: Secondary | ICD-10-CM | POA: Diagnosis not present

## 2016-06-21 DIAGNOSIS — G4733 Obstructive sleep apnea (adult) (pediatric): Secondary | ICD-10-CM | POA: Diagnosis not present

## 2016-07-01 DIAGNOSIS — N318 Other neuromuscular dysfunction of bladder: Secondary | ICD-10-CM | POA: Diagnosis not present

## 2016-07-01 DIAGNOSIS — N401 Enlarged prostate with lower urinary tract symptoms: Secondary | ICD-10-CM | POA: Diagnosis not present

## 2016-07-01 DIAGNOSIS — N3020 Other chronic cystitis without hematuria: Secondary | ICD-10-CM | POA: Diagnosis not present

## 2016-07-01 DIAGNOSIS — E291 Testicular hypofunction: Secondary | ICD-10-CM | POA: Diagnosis not present

## 2016-07-01 DIAGNOSIS — N210 Calculus in bladder: Secondary | ICD-10-CM | POA: Diagnosis not present

## 2016-07-19 DIAGNOSIS — M1711 Unilateral primary osteoarthritis, right knee: Secondary | ICD-10-CM | POA: Diagnosis not present

## 2016-07-20 DIAGNOSIS — R066 Hiccough: Secondary | ICD-10-CM | POA: Diagnosis not present

## 2016-07-20 DIAGNOSIS — R05 Cough: Secondary | ICD-10-CM | POA: Diagnosis not present

## 2016-07-20 DIAGNOSIS — I25810 Atherosclerosis of coronary artery bypass graft(s) without angina pectoris: Secondary | ICD-10-CM | POA: Diagnosis not present

## 2016-07-24 DIAGNOSIS — R066 Hiccough: Secondary | ICD-10-CM | POA: Diagnosis not present

## 2016-09-03 DIAGNOSIS — Z87898 Personal history of other specified conditions: Secondary | ICD-10-CM | POA: Diagnosis not present

## 2016-09-03 DIAGNOSIS — N182 Chronic kidney disease, stage 2 (mild): Secondary | ICD-10-CM | POA: Diagnosis not present

## 2016-09-03 DIAGNOSIS — K219 Gastro-esophageal reflux disease without esophagitis: Secondary | ICD-10-CM | POA: Diagnosis not present

## 2016-09-03 DIAGNOSIS — E559 Vitamin D deficiency, unspecified: Secondary | ICD-10-CM | POA: Diagnosis not present

## 2016-09-03 DIAGNOSIS — I130 Hypertensive heart and chronic kidney disease with heart failure and stage 1 through stage 4 chronic kidney disease, or unspecified chronic kidney disease: Secondary | ICD-10-CM | POA: Diagnosis not present

## 2016-09-03 DIAGNOSIS — E785 Hyperlipidemia, unspecified: Secondary | ICD-10-CM | POA: Diagnosis not present

## 2016-09-03 DIAGNOSIS — R7303 Prediabetes: Secondary | ICD-10-CM | POA: Diagnosis not present

## 2016-09-03 DIAGNOSIS — I480 Paroxysmal atrial fibrillation: Secondary | ICD-10-CM | POA: Diagnosis not present

## 2016-09-03 DIAGNOSIS — J449 Chronic obstructive pulmonary disease, unspecified: Secondary | ICD-10-CM | POA: Diagnosis not present

## 2016-09-03 DIAGNOSIS — Z23 Encounter for immunization: Secondary | ICD-10-CM | POA: Diagnosis not present

## 2016-09-20 DIAGNOSIS — J301 Allergic rhinitis due to pollen: Secondary | ICD-10-CM | POA: Diagnosis not present

## 2016-09-20 DIAGNOSIS — J4520 Mild intermittent asthma, uncomplicated: Secondary | ICD-10-CM | POA: Diagnosis not present

## 2016-09-20 DIAGNOSIS — G4733 Obstructive sleep apnea (adult) (pediatric): Secondary | ICD-10-CM | POA: Diagnosis not present

## 2016-10-17 DIAGNOSIS — I110 Hypertensive heart disease with heart failure: Secondary | ICD-10-CM | POA: Diagnosis not present

## 2016-10-17 DIAGNOSIS — Z7901 Long term (current) use of anticoagulants: Secondary | ICD-10-CM | POA: Diagnosis not present

## 2016-10-17 DIAGNOSIS — I481 Persistent atrial fibrillation: Secondary | ICD-10-CM | POA: Diagnosis not present

## 2016-10-17 DIAGNOSIS — I5032 Chronic diastolic (congestive) heart failure: Secondary | ICD-10-CM | POA: Diagnosis not present

## 2016-10-17 DIAGNOSIS — I25119 Atherosclerotic heart disease of native coronary artery with unspecified angina pectoris: Secondary | ICD-10-CM | POA: Diagnosis not present

## 2016-10-17 DIAGNOSIS — E785 Hyperlipidemia, unspecified: Secondary | ICD-10-CM | POA: Diagnosis not present

## 2016-11-02 DIAGNOSIS — J209 Acute bronchitis, unspecified: Secondary | ICD-10-CM | POA: Diagnosis not present

## 2016-12-04 DIAGNOSIS — H2513 Age-related nuclear cataract, bilateral: Secondary | ICD-10-CM | POA: Diagnosis not present

## 2016-12-05 DIAGNOSIS — J111 Influenza due to unidentified influenza virus with other respiratory manifestations: Secondary | ICD-10-CM | POA: Diagnosis not present

## 2016-12-05 DIAGNOSIS — H6122 Impacted cerumen, left ear: Secondary | ICD-10-CM | POA: Diagnosis not present

## 2016-12-30 DIAGNOSIS — N401 Enlarged prostate with lower urinary tract symptoms: Secondary | ICD-10-CM | POA: Diagnosis not present

## 2016-12-30 DIAGNOSIS — E291 Testicular hypofunction: Secondary | ICD-10-CM | POA: Diagnosis not present

## 2017-01-02 DIAGNOSIS — E785 Hyperlipidemia, unspecified: Secondary | ICD-10-CM | POA: Diagnosis not present

## 2017-01-02 DIAGNOSIS — E559 Vitamin D deficiency, unspecified: Secondary | ICD-10-CM | POA: Diagnosis not present

## 2017-01-02 DIAGNOSIS — N182 Chronic kidney disease, stage 2 (mild): Secondary | ICD-10-CM | POA: Diagnosis not present

## 2017-01-02 DIAGNOSIS — K219 Gastro-esophageal reflux disease without esophagitis: Secondary | ICD-10-CM | POA: Diagnosis not present

## 2017-01-02 DIAGNOSIS — I130 Hypertensive heart and chronic kidney disease with heart failure and stage 1 through stage 4 chronic kidney disease, or unspecified chronic kidney disease: Secondary | ICD-10-CM | POA: Diagnosis not present

## 2017-01-02 DIAGNOSIS — I480 Paroxysmal atrial fibrillation: Secondary | ICD-10-CM | POA: Diagnosis not present

## 2017-01-02 DIAGNOSIS — J449 Chronic obstructive pulmonary disease, unspecified: Secondary | ICD-10-CM | POA: Diagnosis not present

## 2017-01-02 DIAGNOSIS — R7303 Prediabetes: Secondary | ICD-10-CM | POA: Diagnosis not present

## 2017-01-16 DIAGNOSIS — G4733 Obstructive sleep apnea (adult) (pediatric): Secondary | ICD-10-CM | POA: Diagnosis not present

## 2017-01-16 DIAGNOSIS — J4520 Mild intermittent asthma, uncomplicated: Secondary | ICD-10-CM | POA: Diagnosis not present

## 2017-01-16 DIAGNOSIS — J301 Allergic rhinitis due to pollen: Secondary | ICD-10-CM | POA: Diagnosis not present

## 2017-01-21 DIAGNOSIS — M1711 Unilateral primary osteoarthritis, right knee: Secondary | ICD-10-CM | POA: Diagnosis not present

## 2017-01-22 DIAGNOSIS — R066 Hiccough: Secondary | ICD-10-CM | POA: Diagnosis not present

## 2017-04-03 DIAGNOSIS — Z1211 Encounter for screening for malignant neoplasm of colon: Secondary | ICD-10-CM | POA: Diagnosis not present

## 2017-04-03 DIAGNOSIS — Z125 Encounter for screening for malignant neoplasm of prostate: Secondary | ICD-10-CM | POA: Diagnosis not present

## 2017-04-03 DIAGNOSIS — E7800 Pure hypercholesterolemia, unspecified: Secondary | ICD-10-CM | POA: Diagnosis not present

## 2017-04-03 DIAGNOSIS — Z0000 Encounter for general adult medical examination without abnormal findings: Secondary | ICD-10-CM | POA: Diagnosis not present

## 2017-04-03 DIAGNOSIS — Z6831 Body mass index (BMI) 31.0-31.9, adult: Secondary | ICD-10-CM | POA: Diagnosis not present

## 2017-04-03 DIAGNOSIS — I10 Essential (primary) hypertension: Secondary | ICD-10-CM | POA: Diagnosis not present

## 2017-04-03 DIAGNOSIS — Z79899 Other long term (current) drug therapy: Secondary | ICD-10-CM | POA: Diagnosis not present

## 2017-04-03 DIAGNOSIS — E559 Vitamin D deficiency, unspecified: Secondary | ICD-10-CM | POA: Diagnosis not present

## 2017-04-03 DIAGNOSIS — E669 Obesity, unspecified: Secondary | ICD-10-CM | POA: Diagnosis not present

## 2017-04-16 DIAGNOSIS — I110 Hypertensive heart disease with heart failure: Secondary | ICD-10-CM | POA: Diagnosis not present

## 2017-04-16 DIAGNOSIS — Z7901 Long term (current) use of anticoagulants: Secondary | ICD-10-CM | POA: Diagnosis not present

## 2017-04-16 DIAGNOSIS — E785 Hyperlipidemia, unspecified: Secondary | ICD-10-CM | POA: Diagnosis not present

## 2017-04-16 DIAGNOSIS — I25119 Atherosclerotic heart disease of native coronary artery with unspecified angina pectoris: Secondary | ICD-10-CM | POA: Diagnosis not present

## 2017-04-16 DIAGNOSIS — I481 Persistent atrial fibrillation: Secondary | ICD-10-CM | POA: Diagnosis not present

## 2017-04-16 DIAGNOSIS — I5032 Chronic diastolic (congestive) heart failure: Secondary | ICD-10-CM | POA: Diagnosis not present

## 2017-04-17 DIAGNOSIS — I5032 Chronic diastolic (congestive) heart failure: Secondary | ICD-10-CM | POA: Diagnosis not present

## 2017-04-17 DIAGNOSIS — K219 Gastro-esophageal reflux disease without esophagitis: Secondary | ICD-10-CM | POA: Diagnosis not present

## 2017-04-17 DIAGNOSIS — I130 Hypertensive heart and chronic kidney disease with heart failure and stage 1 through stage 4 chronic kidney disease, or unspecified chronic kidney disease: Secondary | ICD-10-CM | POA: Diagnosis not present

## 2017-04-17 DIAGNOSIS — E785 Hyperlipidemia, unspecified: Secondary | ICD-10-CM | POA: Diagnosis not present

## 2017-04-17 DIAGNOSIS — Z87898 Personal history of other specified conditions: Secondary | ICD-10-CM | POA: Diagnosis not present

## 2017-04-17 DIAGNOSIS — I480 Paroxysmal atrial fibrillation: Secondary | ICD-10-CM | POA: Diagnosis not present

## 2017-04-17 DIAGNOSIS — J449 Chronic obstructive pulmonary disease, unspecified: Secondary | ICD-10-CM | POA: Diagnosis not present

## 2017-04-17 DIAGNOSIS — N182 Chronic kidney disease, stage 2 (mild): Secondary | ICD-10-CM | POA: Diagnosis not present

## 2017-04-17 DIAGNOSIS — Z79899 Other long term (current) drug therapy: Secondary | ICD-10-CM | POA: Diagnosis not present

## 2017-04-17 DIAGNOSIS — R195 Other fecal abnormalities: Secondary | ICD-10-CM | POA: Diagnosis not present

## 2017-04-17 DIAGNOSIS — E669 Obesity, unspecified: Secondary | ICD-10-CM | POA: Diagnosis not present

## 2017-04-17 DIAGNOSIS — R7303 Prediabetes: Secondary | ICD-10-CM | POA: Diagnosis not present

## 2017-04-22 DIAGNOSIS — R195 Other fecal abnormalities: Secondary | ICD-10-CM | POA: Diagnosis not present

## 2017-04-25 DIAGNOSIS — J209 Acute bronchitis, unspecified: Secondary | ICD-10-CM | POA: Diagnosis not present

## 2017-05-07 DIAGNOSIS — J309 Allergic rhinitis, unspecified: Secondary | ICD-10-CM | POA: Diagnosis not present

## 2017-05-28 DIAGNOSIS — K649 Unspecified hemorrhoids: Secondary | ICD-10-CM | POA: Diagnosis not present

## 2017-05-28 DIAGNOSIS — Z720 Tobacco use: Secondary | ICD-10-CM | POA: Diagnosis not present

## 2017-05-28 DIAGNOSIS — I10 Essential (primary) hypertension: Secondary | ICD-10-CM | POA: Diagnosis not present

## 2017-05-28 DIAGNOSIS — K5730 Diverticulosis of large intestine without perforation or abscess without bleeding: Secondary | ICD-10-CM | POA: Diagnosis not present

## 2017-05-28 DIAGNOSIS — I25810 Atherosclerosis of coronary artery bypass graft(s) without angina pectoris: Secondary | ICD-10-CM | POA: Diagnosis not present

## 2017-05-28 DIAGNOSIS — Z955 Presence of coronary angioplasty implant and graft: Secondary | ICD-10-CM | POA: Diagnosis not present

## 2017-05-28 DIAGNOSIS — E669 Obesity, unspecified: Secondary | ICD-10-CM | POA: Diagnosis not present

## 2017-05-28 DIAGNOSIS — R195 Other fecal abnormalities: Secondary | ICD-10-CM | POA: Diagnosis not present

## 2017-05-28 DIAGNOSIS — K648 Other hemorrhoids: Secondary | ICD-10-CM | POA: Diagnosis not present

## 2017-05-28 DIAGNOSIS — E7800 Pure hypercholesterolemia, unspecified: Secondary | ICD-10-CM | POA: Diagnosis not present

## 2017-05-28 DIAGNOSIS — D124 Benign neoplasm of descending colon: Secondary | ICD-10-CM | POA: Diagnosis not present

## 2017-05-28 HISTORY — PX: COLONOSCOPY W/ POLYPECTOMY: SHX1380

## 2017-05-30 DIAGNOSIS — J301 Allergic rhinitis due to pollen: Secondary | ICD-10-CM | POA: Diagnosis not present

## 2017-05-30 DIAGNOSIS — J4520 Mild intermittent asthma, uncomplicated: Secondary | ICD-10-CM | POA: Diagnosis not present

## 2017-06-30 DIAGNOSIS — N401 Enlarged prostate with lower urinary tract symptoms: Secondary | ICD-10-CM | POA: Diagnosis not present

## 2017-06-30 DIAGNOSIS — E291 Testicular hypofunction: Secondary | ICD-10-CM | POA: Diagnosis not present

## 2017-06-30 DIAGNOSIS — N3020 Other chronic cystitis without hematuria: Secondary | ICD-10-CM | POA: Diagnosis not present

## 2017-06-30 DIAGNOSIS — N318 Other neuromuscular dysfunction of bladder: Secondary | ICD-10-CM | POA: Diagnosis not present

## 2017-07-23 DIAGNOSIS — B9681 Helicobacter pylori [H. pylori] as the cause of diseases classified elsewhere: Secondary | ICD-10-CM | POA: Diagnosis not present

## 2017-07-23 DIAGNOSIS — K449 Diaphragmatic hernia without obstruction or gangrene: Secondary | ICD-10-CM | POA: Diagnosis not present

## 2017-07-23 DIAGNOSIS — Z955 Presence of coronary angioplasty implant and graft: Secondary | ICD-10-CM | POA: Diagnosis not present

## 2017-07-23 DIAGNOSIS — I10 Essential (primary) hypertension: Secondary | ICD-10-CM | POA: Diagnosis not present

## 2017-07-23 DIAGNOSIS — R195 Other fecal abnormalities: Secondary | ICD-10-CM | POA: Diagnosis not present

## 2017-07-23 DIAGNOSIS — E7800 Pure hypercholesterolemia, unspecified: Secondary | ICD-10-CM | POA: Diagnosis not present

## 2017-07-23 DIAGNOSIS — F17220 Nicotine dependence, chewing tobacco, uncomplicated: Secondary | ICD-10-CM | POA: Diagnosis not present

## 2017-07-23 DIAGNOSIS — E669 Obesity, unspecified: Secondary | ICD-10-CM | POA: Diagnosis not present

## 2017-07-23 DIAGNOSIS — K219 Gastro-esophageal reflux disease without esophagitis: Secondary | ICD-10-CM | POA: Diagnosis not present

## 2017-07-23 DIAGNOSIS — K2900 Acute gastritis without bleeding: Secondary | ICD-10-CM | POA: Diagnosis not present

## 2017-07-23 DIAGNOSIS — K2970 Gastritis, unspecified, without bleeding: Secondary | ICD-10-CM | POA: Diagnosis not present

## 2017-07-23 DIAGNOSIS — I25810 Atherosclerosis of coronary artery bypass graft(s) without angina pectoris: Secondary | ICD-10-CM | POA: Diagnosis not present

## 2017-07-23 HISTORY — PX: ESOPHAGOGASTRODUODENOSCOPY: SHX1529

## 2017-08-08 ENCOUNTER — Other Ambulatory Visit: Payer: Self-pay | Admitting: Cardiology

## 2017-08-08 NOTE — Telephone Encounter (Signed)
Left message to return call to confirm if patient will be following Dr. Bettina Gavia.

## 2017-08-12 DIAGNOSIS — M1711 Unilateral primary osteoarthritis, right knee: Secondary | ICD-10-CM | POA: Diagnosis not present

## 2017-08-16 ENCOUNTER — Other Ambulatory Visit: Payer: Self-pay | Admitting: Cardiology

## 2017-08-18 MED ORDER — EDOXABAN TOSYLATE 60 MG PO TABS: 60.0000 mg | ORAL_TABLET | Freq: Every day | ORAL | 1 refills | Status: DC

## 2017-08-18 NOTE — Addendum Note (Signed)
Addended by: Kathyrn Sheriff on: 08/18/2017 08:50 AM   Modules accepted: Orders

## 2017-08-22 DIAGNOSIS — M1711 Unilateral primary osteoarthritis, right knee: Secondary | ICD-10-CM | POA: Diagnosis not present

## 2017-08-27 DIAGNOSIS — G4733 Obstructive sleep apnea (adult) (pediatric): Secondary | ICD-10-CM | POA: Insufficient documentation

## 2017-08-27 DIAGNOSIS — I517 Cardiomegaly: Secondary | ICD-10-CM

## 2017-08-27 DIAGNOSIS — M1990 Unspecified osteoarthritis, unspecified site: Secondary | ICD-10-CM

## 2017-08-27 DIAGNOSIS — I059 Rheumatic mitral valve disease, unspecified: Secondary | ICD-10-CM

## 2017-08-27 DIAGNOSIS — E785 Hyperlipidemia, unspecified: Secondary | ICD-10-CM

## 2017-08-27 DIAGNOSIS — R739 Hyperglycemia, unspecified: Secondary | ICD-10-CM

## 2017-08-27 DIAGNOSIS — I0981 Rheumatic heart failure: Secondary | ICD-10-CM

## 2017-08-27 DIAGNOSIS — K219 Gastro-esophageal reflux disease without esophagitis: Secondary | ICD-10-CM | POA: Insufficient documentation

## 2017-08-27 HISTORY — DX: Rheumatic mitral valve disease, unspecified: I05.9

## 2017-08-27 HISTORY — DX: Gilbert syndrome: E80.4

## 2017-08-27 HISTORY — DX: Rheumatic heart failure: I09.81

## 2017-08-27 HISTORY — DX: Cardiomegaly: I51.7

## 2017-08-27 HISTORY — DX: Obstructive sleep apnea (adult) (pediatric): G47.33

## 2017-08-27 HISTORY — DX: Hyperglycemia, unspecified: R73.9

## 2017-08-27 HISTORY — DX: Gastro-esophageal reflux disease without esophagitis: K21.9

## 2017-08-27 HISTORY — DX: Hyperlipidemia, unspecified: E78.5

## 2017-08-27 HISTORY — DX: Unspecified osteoarthritis, unspecified site: M19.90

## 2017-09-19 DIAGNOSIS — G4733 Obstructive sleep apnea (adult) (pediatric): Secondary | ICD-10-CM | POA: Diagnosis not present

## 2017-09-19 DIAGNOSIS — J4520 Mild intermittent asthma, uncomplicated: Secondary | ICD-10-CM | POA: Diagnosis not present

## 2017-09-19 DIAGNOSIS — J301 Allergic rhinitis due to pollen: Secondary | ICD-10-CM | POA: Diagnosis not present

## 2017-09-30 ENCOUNTER — Other Ambulatory Visit: Payer: Self-pay | Admitting: *Deleted

## 2017-09-30 DIAGNOSIS — G4733 Obstructive sleep apnea (adult) (pediatric): Secondary | ICD-10-CM | POA: Diagnosis not present

## 2017-10-17 ENCOUNTER — Ambulatory Visit (INDEPENDENT_AMBULATORY_CARE_PROVIDER_SITE_OTHER): Payer: Medicare Other | Admitting: Cardiology

## 2017-10-17 ENCOUNTER — Encounter: Payer: Self-pay | Admitting: Cardiology

## 2017-10-17 VITALS — BP 132/80 | HR 78 | Ht 71.0 in | Wt 231.0 lb

## 2017-10-17 DIAGNOSIS — I4821 Permanent atrial fibrillation: Secondary | ICD-10-CM

## 2017-10-17 DIAGNOSIS — Z7901 Long term (current) use of anticoagulants: Secondary | ICD-10-CM | POA: Diagnosis not present

## 2017-10-17 DIAGNOSIS — I482 Chronic atrial fibrillation: Secondary | ICD-10-CM

## 2017-10-17 DIAGNOSIS — I25118 Atherosclerotic heart disease of native coronary artery with other forms of angina pectoris: Secondary | ICD-10-CM | POA: Diagnosis not present

## 2017-10-17 DIAGNOSIS — I110 Hypertensive heart disease with heart failure: Secondary | ICD-10-CM

## 2017-10-17 DIAGNOSIS — E785 Hyperlipidemia, unspecified: Secondary | ICD-10-CM | POA: Diagnosis not present

## 2017-10-17 DIAGNOSIS — I5032 Chronic diastolic (congestive) heart failure: Secondary | ICD-10-CM

## 2017-10-17 NOTE — Patient Instructions (Addendum)
Medication Instructions:  Your physician recommends that you continue on your current medications as directed. Please refer to the Current Medication list given to you today.  Labwork: None  Testing/Procedures: Your physician has requested that you have a lexiscan myoview. For further information please visit [email or web address]. Please follow instruction sheet, as given.  Follow-Up: Your physician wants you to follow-up in: 8  months (July, 2019). You will receive a reminder letter in the mail two months in advance. If you don't receive a letter, please call our office to schedule the follow-up appointment.  Any Other Special Instructions Will Be Listed Below (If Applicable).     If you need a refill on your cardiac medications before your next appointment, please call your pharmacy.    Have a CMP BNP and lipids at Dr Marcello Moores office

## 2017-10-17 NOTE — Progress Notes (Signed)
Cardiology Office Note:    Date:  10/17/2017   ID:  Jason Stark, DOB [birth year], MRN [id number]  PCP:  Melony Overly, MD  Cardiologist:  Shirlee More, MD    Referring MD: Melony Overly, MD    ASSESSMENT:    1. Chronic diastolic heart failure ([place])   2. Permanent atrial fibrillation ([place])   3. Coronary artery disease of native artery of native heart with stable angina pectoris ([place])   4. Chronic anticoagulation   5. Hyperlipidemia, unspecified hyperlipidemia type   6. Hypertensive heart disease with heart failure ([place])    PLAN:    In order of problems listed above:  1. Stable compensated continue his current diuretic recheck ejection fraction as part of MPI.  I think this is important with his CAD and previous bypass surgery and subsequent further revascularization.  Recent labs requested from his PCP for renal function potassium 2. Stable rate controlled continue his beta-blocker and current anticoagulant 3. Stable continue current medical treatment aspirin and anticoagulant beta-blocker and myocardial perfusion study to assess for ischemia number status 4. Stable continue his anticoagulant 5. Poorly controlled statin intolerant at next visit we will discuss alternative antibiotic therapy 6. Stable blood pressure at target continue diuretic beta-blocker   Next appointment: 6 months   Medication Adjustments/Labs and Tests Ordered: Current medicines are reviewed at length with the patient today.  Concerns regarding medicines are outlined above.  Orders Placed This Encounter  Procedures  . Myocardial Perfusion Imaging   No orders of the defined types were placed in this encounter.   Chief Complaint  Patient presents with  . Follow-up  . Atrial Fibrillation  . Hypertension  . Congestive Heart Failure  . Coronary Artery Disease  . Hyperlipidemia    History of Present Illness:    Jason Stark is a 71 y.o. male with a hx of CAD, CHF, Atrial  Fibrillation, Dyslipidemia, S/P CABG, S/P PCI 03/13/16 PCI to the proximaL LCX with Xience  stent last seen 6 months ago. Compliance with diet, lifestyle and medications: Yes He is now more than 1 year from his last PCI.  Overall and generally feels well still does have a garden work but notices exercise intolerance fatigue and is quite concerned regarding the potential for recurrent ischemia.  He is not having angina exertional dyspnea or palpitation.  Initiated discussion of rectal bleeding is anticoagulated but then tells me he has been seen by GI and has had both upper and lower endoscopy performed. Past Medical History:  Diagnosis Date  . Abnormal myocardial perfusion study 03/11/2016   Overview:  Extensive lateral ischemia , [name] 44% in distribution of SVG to [facility] and M  . Abnormal stress test 03/21/2016  . Blood glucose elevated 08/27/2017  . CAD (coronary artery disease), native coronary artery 06/13/2015   Overview:  Cardiac cath 03/13/16: Diagnostic Summary Severe native multivessel disease as described below Patent LIMA to LAD Patent SVG to PDA Occluded SVG to OM/OM2 ( jump graft) Occluded SVG to Diagonal Normal LV function Diagnostic Recommendations PCI to the Proximal LCX Medical therapy for the other disease Interventional Summary Successful PCI to the proximaL LCX with Xience 3.0 x 28 post dilated with 3.5 x 15 [place] euphora  . Cardiomegaly 08/27/2017  . Chronic anticoagulation 12/19/2015   Overview:  Edoxaban started 12/14/15  . Chronic diastolic heart failure ([place]) 06/13/2015  . Dyslipidemia 06/13/2015  . Esophageal reflux 08/27/2017  . Gilbert syndrome 08/27/2017  . High risk medication use 06/13/2015  Overview:  Sotolol  . Hyperlipidemia 08/27/2017  . Hypertensive heart disease with heart failure ([place]) 06/13/2015  . OSA (obstructive sleep apnea) 08/27/2017  . Osteoarthritis 08/27/2017  . Persistent atrial fibrillation ([place]) 06/13/2015  . Rheumatic disease of mitral valve 08/27/2017  . Rheumatic heart  failure (congestive) ([place]) 08/27/2017    Past Surgical History:  Procedure Laterality Date  . A FLUTTER ABLATION     a tach/flutter  . CHOLECYSTECTOMY    . COLONOSCOPY W/ POLYPECTOMY     non cancerous polyp. also found hemorrhoids  . CORONARY ANGIOPLASTY WITH STENT PLACEMENT    . CORONARY ARTERY BYPASS GRAFT  2011   with Maze and LAA ligation   . HERNIA REPAIR    . KNEE SURGERY    . PROSTATE SURGERY    . RECTAL SURGERY      Current Medications: Current Meds  Medication Sig  . amLODipine-benazepril (LOTREL) 5-20 MG capsule Take by mouth.  [name] aspirin EC 81 MG tablet Take 81 mg by mouth.  . DOCOSAHEXAENOIC ACID PO Take by mouth.  . edoxaban (SAVAYSA) 60 MG TABS tablet Take 60 mg by mouth daily.  . Fluticasone-Salmeterol (ADVAIR DISKUS) 250-50 MCG/DOSE AEPB   . furosemide (LASIX) 40 MG tablet TAKE 1 TABLET DAILY OR AS DIRECTED  . loratadine (CLARITIN) 10 MG tablet Take 10 mg by mouth.  . metoprolol tartrate (LOPRESSOR) 25 MG tablet TAKE 1 TABLET TWICE A DAY  . Multiple Vitamins-Minerals (MULTIVITAMIN WITH MINERALS) tablet Take 1 tablet daily by mouth.  . potassium chloride (KLOR-CON M10) 10 MEQ tablet TAKE 1 TABLET DAILY  . ranitidine (ZANTAC) 300 MG capsule Take by mouth.     Allergies:   Patient has no known allergies.   Social History   Socioeconomic History  . Marital status: Married    Spouse name: None  . Number of children: None  . Years of education: None  . Highest education level: None  Social Needs  . Financial resource strain: None  . Food insecurity - worry: None  . Food insecurity - inability: None  . Transportation needs - medical: None  . Transportation needs - non-medical: None  Occupational History  . None  Tobacco Use  . Smoking status: Former Smoker    Packs/day: 0.25    Years: 25.00    Pack years: 6.25    Types: Cigarettes    Last attempt to quit: 2010    Years since quitting: 8.8  . Smokeless tobacco: Former Systems developer    Types: [place]  date: 2010  Substance and Sexual Activity  . Alcohol use: No  . Drug use: No  . Sexual activity: None  Other Topics Concern  . None  Social History Narrative  . None     Family History: The patient's family history includes Diabetes in his mother; Hyperlipidemia in his brother and mother; Hypertension in his brother and father. ROS:   Please see the history of present illness.    All other systems reviewed and are negative.  EKGs/Labs/Other Studies Reviewed:    The following studies were reviewed today: Recent labs requested from his PCP  Recent Labs: No results found for requested labs within last 8760 hours.  Recent Lipid Panel No results found for: CHOL, TRIG, HDL, CHOLHDL, VLDL, LDLCALC, LDLDIRECT  Physical Exam:    VS:  BP 132/80 (BP Location: Left Arm, Patient Position: Sitting)   Pulse 78   Ht 5\' 11"  (1.803 m)   Wt 231 lb (104.8 kg)  SpO2 90%   BMI 32.22 kg/m     Wt Readings from Last 3 Encounters:  10/17/17 231 lb (104.8 kg)     GEN:  Well nourished, well developed in no acute distress HEENT: Normal NECK: No JVD; No carotid bruits LYMPHATICS: No lymphadenopathy CARDIAC: Variable first heart sound no murmur no gallop irregular irregular rhythm  RESPIRATORY:  Clear to auscultation without rales, wheezing or rhonchi  ABDOMEN: Soft, non-tender, non-distended MUSCULOSKELETAL:  No edema; No deformity  SKIN: Warm and dry NEUROLOGIC:  Alert and oriented x 3 PSYCHIATRIC:  Normal affect    Signed, Shirlee More, MD  10/17/2017 12:31 PM    [facility]

## 2017-10-20 ENCOUNTER — Telehealth (HOSPITAL_COMMUNITY): Payer: Self-pay | Admitting: *Deleted

## 2017-10-20 NOTE — Telephone Encounter (Signed)
Left message on voicemail per DPR in reference to upcoming appointment scheduled on 10/22/17 with detailed instructions given per Myocardial Perfusion Study Information Sheet for the test. LM to arrive 15 minutes early, and that it is imperative to arrive on time for appointment to keep from having the test rescheduled. If you need to cancel or reschedule your appointment, please call the office within 24 hours of your appointment. Failure to do so may result in a cancellation of your appointment, and a $50 no show fee. Phone number given for call back for any questions. Kirstie Peri

## 2017-10-21 DIAGNOSIS — I480 Paroxysmal atrial fibrillation: Secondary | ICD-10-CM | POA: Diagnosis not present

## 2017-10-21 DIAGNOSIS — N182 Chronic kidney disease, stage 2 (mild): Secondary | ICD-10-CM | POA: Diagnosis not present

## 2017-10-21 DIAGNOSIS — E785 Hyperlipidemia, unspecified: Secondary | ICD-10-CM | POA: Diagnosis not present

## 2017-10-21 DIAGNOSIS — K219 Gastro-esophageal reflux disease without esophagitis: Secondary | ICD-10-CM | POA: Diagnosis not present

## 2017-10-21 DIAGNOSIS — J449 Chronic obstructive pulmonary disease, unspecified: Secondary | ICD-10-CM | POA: Diagnosis not present

## 2017-10-21 DIAGNOSIS — R7303 Prediabetes: Secondary | ICD-10-CM | POA: Diagnosis not present

## 2017-10-21 DIAGNOSIS — I5032 Chronic diastolic (congestive) heart failure: Secondary | ICD-10-CM | POA: Diagnosis not present

## 2017-10-21 DIAGNOSIS — I130 Hypertensive heart and chronic kidney disease with heart failure and stage 1 through stage 4 chronic kidney disease, or unspecified chronic kidney disease: Secondary | ICD-10-CM | POA: Diagnosis not present

## 2017-10-21 DIAGNOSIS — Z79899 Other long term (current) drug therapy: Secondary | ICD-10-CM | POA: Diagnosis not present

## 2017-10-22 ENCOUNTER — Ambulatory Visit (HOSPITAL_COMMUNITY): Payer: Medicare Other | Attending: Cardiology

## 2017-10-22 DIAGNOSIS — I25118 Atherosclerotic heart disease of native coronary artery with other forms of angina pectoris: Secondary | ICD-10-CM | POA: Diagnosis not present

## 2017-10-22 LAB — MYOCARDIAL PERFUSION IMAGING
CHL CUP RESTING HR STRESS: 74 {beats}/min
LHR: 0.3
NUC STRESS TID: 1.06
Peak HR: 81 {beats}/min
SDS: 2
SRS: 8
SSS: 10

## 2017-10-22 MED ORDER — TECHNETIUM TC 99M TETROFOSMIN IV KIT
10.2000 | PACK | Freq: Once | INTRAVENOUS | Status: AC | PRN
  Administered 2017-10-22: 10.2 via INTRAVENOUS
  Filled 2017-10-22: qty 11

## 2017-10-22 MED ORDER — REGADENOSON 0.4 MG/5ML IV SOLN
0.4000 mg | Freq: Once | INTRAVENOUS | Status: AC
  Administered 2017-10-22: 0.4 mg via INTRAVENOUS

## 2017-10-22 MED ORDER — TECHNETIUM TC 99M TETROFOSMIN IV KIT
31.2000 | PACK | Freq: Once | INTRAVENOUS | Status: AC | PRN
Start: 2017-10-22 — End: 2017-10-22
  Administered 2017-10-22: 31.2 via INTRAVENOUS
  Filled 2017-10-22: qty 32

## 2017-10-23 ENCOUNTER — Telehealth: Payer: Self-pay | Admitting: Cardiology

## 2017-10-23 NOTE — Telephone Encounter (Signed)
Returning call.

## 2017-10-23 NOTE — Telephone Encounter (Signed)
Patient informed of results.  

## 2017-10-29 ENCOUNTER — Other Ambulatory Visit: Payer: Self-pay | Admitting: Cardiology

## 2017-11-13 DIAGNOSIS — M1711 Unilateral primary osteoarthritis, right knee: Secondary | ICD-10-CM | POA: Diagnosis not present

## 2017-12-03 DIAGNOSIS — G4733 Obstructive sleep apnea (adult) (pediatric): Secondary | ICD-10-CM | POA: Diagnosis not present

## 2017-12-31 ENCOUNTER — Other Ambulatory Visit: Payer: Self-pay | Admitting: Cardiology

## 2018-01-16 DIAGNOSIS — G4733 Obstructive sleep apnea (adult) (pediatric): Secondary | ICD-10-CM | POA: Diagnosis not present

## 2018-01-16 DIAGNOSIS — J301 Allergic rhinitis due to pollen: Secondary | ICD-10-CM | POA: Diagnosis not present

## 2018-01-16 DIAGNOSIS — J4520 Mild intermittent asthma, uncomplicated: Secondary | ICD-10-CM | POA: Diagnosis not present

## 2018-01-26 DIAGNOSIS — J22 Unspecified acute lower respiratory infection: Secondary | ICD-10-CM | POA: Diagnosis not present

## 2018-01-26 DIAGNOSIS — H6122 Impacted cerumen, left ear: Secondary | ICD-10-CM | POA: Diagnosis not present

## 2018-01-27 DIAGNOSIS — J22 Unspecified acute lower respiratory infection: Secondary | ICD-10-CM | POA: Diagnosis not present

## 2018-01-28 DIAGNOSIS — H6120 Impacted cerumen, unspecified ear: Secondary | ICD-10-CM | POA: Diagnosis not present

## 2018-01-30 DIAGNOSIS — J22 Unspecified acute lower respiratory infection: Secondary | ICD-10-CM | POA: Diagnosis not present

## 2018-02-01 DIAGNOSIS — J209 Acute bronchitis, unspecified: Secondary | ICD-10-CM | POA: Diagnosis not present

## 2018-02-11 DIAGNOSIS — G4733 Obstructive sleep apnea (adult) (pediatric): Secondary | ICD-10-CM | POA: Diagnosis not present

## 2018-02-13 DIAGNOSIS — M1711 Unilateral primary osteoarthritis, right knee: Secondary | ICD-10-CM | POA: Diagnosis not present

## 2018-02-14 ENCOUNTER — Other Ambulatory Visit: Payer: Self-pay | Admitting: Cardiology

## 2018-02-18 DIAGNOSIS — J4520 Mild intermittent asthma, uncomplicated: Secondary | ICD-10-CM | POA: Diagnosis not present

## 2018-02-18 DIAGNOSIS — G4733 Obstructive sleep apnea (adult) (pediatric): Secondary | ICD-10-CM | POA: Diagnosis not present

## 2018-02-18 DIAGNOSIS — J301 Allergic rhinitis due to pollen: Secondary | ICD-10-CM | POA: Diagnosis not present

## 2018-03-02 DIAGNOSIS — G4733 Obstructive sleep apnea (adult) (pediatric): Secondary | ICD-10-CM | POA: Diagnosis not present

## 2018-03-04 DIAGNOSIS — J449 Chronic obstructive pulmonary disease, unspecified: Secondary | ICD-10-CM | POA: Diagnosis not present

## 2018-03-04 DIAGNOSIS — I5032 Chronic diastolic (congestive) heart failure: Secondary | ICD-10-CM | POA: Diagnosis not present

## 2018-03-04 DIAGNOSIS — I480 Paroxysmal atrial fibrillation: Secondary | ICD-10-CM | POA: Diagnosis not present

## 2018-03-04 DIAGNOSIS — N182 Chronic kidney disease, stage 2 (mild): Secondary | ICD-10-CM | POA: Diagnosis not present

## 2018-03-04 DIAGNOSIS — K219 Gastro-esophageal reflux disease without esophagitis: Secondary | ICD-10-CM | POA: Diagnosis not present

## 2018-03-04 DIAGNOSIS — R7303 Prediabetes: Secondary | ICD-10-CM | POA: Diagnosis not present

## 2018-03-04 DIAGNOSIS — Z79899 Other long term (current) drug therapy: Secondary | ICD-10-CM | POA: Diagnosis not present

## 2018-03-04 DIAGNOSIS — I130 Hypertensive heart and chronic kidney disease with heart failure and stage 1 through stage 4 chronic kidney disease, or unspecified chronic kidney disease: Secondary | ICD-10-CM | POA: Diagnosis not present

## 2018-03-04 DIAGNOSIS — E785 Hyperlipidemia, unspecified: Secondary | ICD-10-CM | POA: Diagnosis not present

## 2018-03-05 DIAGNOSIS — Z79899 Other long term (current) drug therapy: Secondary | ICD-10-CM | POA: Diagnosis not present

## 2018-03-05 DIAGNOSIS — I130 Hypertensive heart and chronic kidney disease with heart failure and stage 1 through stage 4 chronic kidney disease, or unspecified chronic kidney disease: Secondary | ICD-10-CM | POA: Diagnosis not present

## 2018-03-05 DIAGNOSIS — E785 Hyperlipidemia, unspecified: Secondary | ICD-10-CM | POA: Diagnosis not present

## 2018-03-05 DIAGNOSIS — R7303 Prediabetes: Secondary | ICD-10-CM | POA: Diagnosis not present

## 2018-03-14 DIAGNOSIS — J069 Acute upper respiratory infection, unspecified: Secondary | ICD-10-CM | POA: Diagnosis not present

## 2018-04-07 DIAGNOSIS — Z9181 History of falling: Secondary | ICD-10-CM | POA: Diagnosis not present

## 2018-04-07 DIAGNOSIS — Z136 Encounter for screening for cardiovascular disorders: Secondary | ICD-10-CM | POA: Diagnosis not present

## 2018-04-07 DIAGNOSIS — Z125 Encounter for screening for malignant neoplasm of prostate: Secondary | ICD-10-CM | POA: Diagnosis not present

## 2018-04-07 DIAGNOSIS — E669 Obesity, unspecified: Secondary | ICD-10-CM | POA: Diagnosis not present

## 2018-04-07 DIAGNOSIS — Z6832 Body mass index (BMI) 32.0-32.9, adult: Secondary | ICD-10-CM | POA: Diagnosis not present

## 2018-04-07 DIAGNOSIS — E785 Hyperlipidemia, unspecified: Secondary | ICD-10-CM | POA: Diagnosis not present

## 2018-04-07 DIAGNOSIS — Z0000 Encounter for general adult medical examination without abnormal findings: Secondary | ICD-10-CM | POA: Diagnosis not present

## 2018-04-07 DIAGNOSIS — Z1331 Encounter for screening for depression: Secondary | ICD-10-CM | POA: Diagnosis not present

## 2018-04-08 DIAGNOSIS — G4733 Obstructive sleep apnea (adult) (pediatric): Secondary | ICD-10-CM | POA: Diagnosis not present

## 2018-04-08 DIAGNOSIS — J4520 Mild intermittent asthma, uncomplicated: Secondary | ICD-10-CM | POA: Diagnosis not present

## 2018-04-08 DIAGNOSIS — J301 Allergic rhinitis due to pollen: Secondary | ICD-10-CM | POA: Diagnosis not present

## 2018-04-27 ENCOUNTER — Other Ambulatory Visit: Payer: Self-pay

## 2018-04-27 MED ORDER — POTASSIUM CHLORIDE CRYS ER 10 MEQ PO TBCR: 10.0000 meq | EXTENDED_RELEASE_TABLET | Freq: Every day | ORAL | 1 refills | Status: DC

## 2018-05-13 DIAGNOSIS — J4520 Mild intermittent asthma, uncomplicated: Secondary | ICD-10-CM | POA: Diagnosis not present

## 2018-05-13 DIAGNOSIS — G4733 Obstructive sleep apnea (adult) (pediatric): Secondary | ICD-10-CM | POA: Diagnosis not present

## 2018-05-13 DIAGNOSIS — J301 Allergic rhinitis due to pollen: Secondary | ICD-10-CM | POA: Diagnosis not present

## 2018-05-15 DIAGNOSIS — M1711 Unilateral primary osteoarthritis, right knee: Secondary | ICD-10-CM | POA: Diagnosis not present

## 2018-05-28 DIAGNOSIS — G4733 Obstructive sleep apnea (adult) (pediatric): Secondary | ICD-10-CM | POA: Diagnosis not present

## 2018-06-10 DIAGNOSIS — J301 Allergic rhinitis due to pollen: Secondary | ICD-10-CM | POA: Diagnosis not present

## 2018-06-10 DIAGNOSIS — G4733 Obstructive sleep apnea (adult) (pediatric): Secondary | ICD-10-CM | POA: Diagnosis not present

## 2018-06-10 DIAGNOSIS — J4520 Mild intermittent asthma, uncomplicated: Secondary | ICD-10-CM | POA: Diagnosis not present

## 2018-06-29 DIAGNOSIS — N401 Enlarged prostate with lower urinary tract symptoms: Secondary | ICD-10-CM | POA: Diagnosis not present

## 2018-06-29 DIAGNOSIS — E291 Testicular hypofunction: Secondary | ICD-10-CM | POA: Diagnosis not present

## 2018-06-29 DIAGNOSIS — N318 Other neuromuscular dysfunction of bladder: Secondary | ICD-10-CM | POA: Diagnosis not present

## 2018-06-29 DIAGNOSIS — R351 Nocturia: Secondary | ICD-10-CM | POA: Diagnosis not present

## 2018-08-13 ENCOUNTER — Other Ambulatory Visit: Payer: Self-pay | Admitting: Cardiology

## 2018-08-17 DIAGNOSIS — M1711 Unilateral primary osteoarthritis, right knee: Secondary | ICD-10-CM | POA: Diagnosis not present

## 2018-08-21 ENCOUNTER — Ambulatory Visit (INDEPENDENT_AMBULATORY_CARE_PROVIDER_SITE_OTHER): Payer: Medicare Other | Admitting: Cardiology

## 2018-08-21 ENCOUNTER — Encounter: Payer: Self-pay | Admitting: Cardiology

## 2018-08-21 VITALS — BP 126/84 | HR 64 | Ht 71.0 in | Wt 224.6 lb

## 2018-08-21 DIAGNOSIS — I4821 Permanent atrial fibrillation: Secondary | ICD-10-CM

## 2018-08-21 DIAGNOSIS — I482 Chronic atrial fibrillation: Secondary | ICD-10-CM

## 2018-08-21 DIAGNOSIS — Z7901 Long term (current) use of anticoagulants: Secondary | ICD-10-CM

## 2018-08-21 DIAGNOSIS — I5032 Chronic diastolic (congestive) heart failure: Secondary | ICD-10-CM

## 2018-08-21 DIAGNOSIS — I25118 Atherosclerotic heart disease of native coronary artery with other forms of angina pectoris: Secondary | ICD-10-CM | POA: Diagnosis not present

## 2018-08-21 DIAGNOSIS — I110 Hypertensive heart disease with heart failure: Secondary | ICD-10-CM

## 2018-08-21 MED ORDER — EZETIMIBE 10 MG PO TABS: 10.0000 mg | ORAL_TABLET | Freq: Every day | ORAL | 3 refills | Status: DC

## 2018-08-21 MED ORDER — EZETIMIBE 10 MG PO TABS: 10.0000 mg | ORAL_TABLET | Freq: Every day | ORAL | 1 refills | Status: DC

## 2018-08-21 NOTE — Patient Instructions (Addendum)
Medication Instructions:  Your physician has recommended you make the following change in your medication:  START: Zetia 10mg  one tablet daily   Labwork: Your physician recommends that you return for lab work in 1 month for Lipids, CMP, and ProBNP   Testing/Procedures: You had an EKG today.   Follow-Up: Your physician wants you to follow-up in: 6 months.  You will receive a reminder letter in the mail two months in advance. If you don't receive a letter, please call our office to schedule the follow-up appointment.   Any Other Special Instructions Will Be Listed Below (If Applicable).     If you need a refill on your cardiac medications before your next appointment, please call your pharmacy.      Heart Failure  Weigh yourself every morning when you first wake up and record on a calender or note pad, bring this to your office visits. Using a pill tender can help with taking your medications consistently.  Limit your fluid intake to 2 liters daily  Limit your sodium intake to less than 2-3 grams daily. Ask if you need dietary teaching.  If you gain more than 3 pounds (from your dry weight ), double your dose of diuretic for the day.  If you gain more than 5 pounds (from your dry weight), double your dose of lasix and call your heart failure doctor.  Please do not smoke tobacco since it is very bad for your heart.  Please do not drink alcohol since it can worsen your heart failure.Also avoid OTC nonsteroidal drugs, such as advil, aleve and motrin.  Try to exercise for at least 30 minutes every day because this will help your heart be more efficient. You may be eligible for supervised cardiac rehab, ask your physician.         Mediterranean diet: A heart-healthy eating plan The heart-healthy Mediterranean diet is a healthy eating plan based on typical foods and recipes of Mediterranean-style cooking. Here's how to adopt the Mediterranean diet. By [facility] Staff   If you're looking for a heart-healthy eating plan, the Mediterranean diet might be right for you. The Mediterranean diet incorporates the basics of healthy eating - plus a splash of flavorful olive oil and perhaps a glass of red wine - among other components characterizing the traditional cooking style of countries bordering the [employer]. Most healthy diets include fruits, vegetables, fish and whole grains, and limit unhealthy fats. While these parts of a healthy diet are tried-and-true, subtle variations or differences in proportions of certain foods may make a difference in your risk of heart disease.  Benefits of the [place] has shown that the traditional Mediterranean diet reduces the risk of heart disease. The diet has been associated with a lower level of oxidized low-density lipoprotein (LDL) cholesterol - the "bad" cholesterol that's more likely to build up deposits in your arteries. In fact, a meta-analysis of more than 1.5 million healthy adults demonstrated that following a Mediterranean diet was associated with a reduced risk of cardiovascular mortality as well as overall mortality. The Mediterranean diet is also associated with a reduced incidence of cancer, and Parkinson's and Alzheimer's diseases. Women who eat a Mediterranean diet supplemented with extra-virgin olive oil and mixed nuts may have a reduced risk of breast cancer. For these reasons, most if not all major scientific organizations encourage healthy adults to adapt a style of eating like that of the [place] for prevention of major chronic diseases.  Key components of  the Mediterranean diet The Mediterranean diet emphasizes: Eating primarily plant-based foods, such as fruits and vegetables, whole grains, legumes and nuts  Replacing butter with healthy fats such as olive oil and canola oil  Using herbs and spices instead of salt to flavor foods  Limiting red meat to no more than a few  times a month  Eating fish and poultry at least twice a week  Enjoying meals with family and friends  Drinking red wine in moderation (optional)  Getting plenty of exercise Fruits, vegetables, nuts and grains  The Mediterranean diet traditionally includes fruits, vegetables, pasta and rice. For example, residents of [place] eat very little red meat and average nine servings a day of antioxidant-rich fruits and vegetables.  Grains in the [place] region are typically whole grain and usually contain very few unhealthy trans fats, and bread is an important part of the diet there. However, throughout the [place] region, bread is eaten plain or dipped in olive oil - not eaten with butter or margarines, which contain saturated or trans fats.  Nuts are another part of a healthy Mediterranean diet. Nuts are high in fat (approximately 80 percent of their calories come from fat), but most of the fat is not saturated. Because nuts are high in calories, they should not be eaten in large amounts - generally no more than a handful a day. Avoid candied or honey-roasted and heavily salted nuts.  Healthy fats The focus of the Mediterranean diet isn't on limiting total fat consumption, but rather to make wise choices about the types of fat you eat. The Mediterranean diet discourages saturated fats and hydrogenated oils (trans fats), both of which contribute to heart disease. The Mediterranean diet features olive oil as the primary source of fat. Olive oil provides monounsaturated fat - a type of fat that can help reduce LDL cholesterol levels when used in place of saturated or trans fats.  "Extra-virgin" and "virgin" olive oils - the least processed forms - also contain the highest levels of the protective plant compounds that provide antioxidant effects. Monounsaturated fats and polyunsaturated fats, such as canola oil and some nuts, contain the beneficial linolenic acid (a type of omega-3 fatty acid).  Omega-3 fatty acids lower triglycerides, decrease blood clotting, are associated with decreased sudden heart attack, improve the health of your blood vessels, and help moderate blood pressure. Fatty fish - such as mackerel, lake trout, herring, sardines, albacore tuna and salmon - are rich sources of omega-3 fatty acids. Fish is eaten on a regular basis in the [place].  Wine The health effects of alcohol have been debated for many years, and some doctors are reluctant to encourage alcohol consumption because of the health consequences of excessive drinking. However, alcohol - in moderation - has been associated with a reduced risk of heart disease in some research studies. The Mediterranean diet typically includes a moderate amount of wine. This means no more than 5 ounces (148 milliliters) of wine daily for women (or men over age 90), and no more than 10 ounces (296 milliliters) of wine daily for men under age 30. If you're unable to limit your alcohol intake to the amounts defined above, if you have a personal or family history of alcohol abuse, or if you have heart or liver disease, refrain from drinking wine or any other alcohol.  Putting it all together The Mediterranean diet is a delicious and healthy way to eat. Many people who switch to this style of eating say they'll never  eat any other way. Here are some specific steps to get you started: Eat your veggies and fruits - and switch to whole grains. An abundance and variety of plant foods should make up the majority of your meals. Strive for seven to 10 servings a day of veggies and fruits. Switch to whole-grain bread and cereal, and begin to eat more whole-grain rice and pasta products.  Go nuts. Keep almonds, cashews, pistachios and walnuts on hand for a quick snack. Choose natural peanut butter, rather than the kind with hydrogenated fat added. Try tahini (blended sesame seeds) as a dip or spread for bread.  Pass on the butter. Try  olive or canola oil as a healthy replacement for butter or margarine. Use it in cooking. Dip bread in flavored olive oil or lightly spread it on whole-grain bread for a tasty alternative to butter. Or try tahini as a dip or spread.  Spice it up. Herbs and spices make food tasty and are also rich in health-promoting substances. Season your meals with herbs and spices rather than salt.  Go fish. Eat fish once or twice a week. Fresh or water-packed tuna, salmon, trout, mackerel and herring are healthy choices. Grilled fish tastes good and requires little cleanup. Avoid fried fish, unless it's sauteed in a small amount of canola oil.  Rein in the red meat. Substitute fish and poultry for red meat. When eaten, make sure it's lean and keep portions small (about the size of a deck of cards). Also avoid sausage, bacon and other high-fat meats.  Choose low-fat dairy. Limit higher fat dairy products such as whole or 2 percent milk, cheese and ice cream. Switch to skim milk, fat-free yogurt and low-fat cheese.  Raise a glass to healthy eating. If it's OK with your doctor, have a glass of wine at dinner. If you don't drink alcohol, you don't need to start. Drinking purple grape juice may be an alternative to wine.

## 2018-08-21 NOTE — Progress Notes (Signed)
Cardiology Office Note:    Date:  08/21/2018   ID:  Jason Stark, DOB 31-May-1946, MRN [id number]  PCP:  Melony Overly, MD  Cardiologist:  Shirlee More, MD    Referring MD: Melony Overly, MD    ASSESSMENT:    1. Permanent atrial fibrillation ([place])   2. Chronic diastolic heart failure ([place])   3. Hypertensive heart disease with heart failure ([place])   4. Chronic anticoagulation   5. Coronary artery disease of native artery of native heart with stable angina pectoris ([place])    PLAN:    In order of problems listed above:  1. Stable rate controlled relatively asymptomatic new continue his current anticoagulant as well as beta-blocker for rate control.  At this time I would not advise trying to restore sinus rhythm. 2. Stable compensated New York Heart Association class I he has no fluid overload he will continue his current loop diuretic and sodium restriction hypertension is stable continue to continue his current regimen including calcium channel blocker and beta-blocker.  For safety in 1 month we will recheck his lipids and also check renal function potassium 3. Stable continue his current direct anticoagulant 4. Chronic stable CAD continue medical treatment. 5. Hyperlipidemia is poorly controlled add Zetia to his low intensity statin recheck lipids in 1 month along with liver function goal LDL less than 70 ideally less than 55   Next appointment: 6 months   Medication Adjustments/Labs and Tests Ordered: Current medicines are reviewed at length with the patient today.  Concerns regarding medicines are outlined above.  No orders of the defined types were placed in this encounter.  No orders of the defined types were placed in this encounter.   Chief Complaint  Patient presents with  . Congestive Heart Failure  . Atrial Fibrillation    History of Present Illness:    Jason Stark is a 72 y.o. male with a hx of CAD, CHF, Atrial Fibrillation, Dyslipidemia,  S/P CABG, S/P PCI 03/13/16 PCI to the proximaL LCX with Xience  stent last seen 10/17/18. Compliance with diet, lifestyle and medications: yes Past Medical History:  Diagnosis Date  . Abnormal myocardial perfusion study 03/11/2016   Overview:  Extensive lateral ischemia , [name] 44% in distribution of SVG to [facility] and M  . Abnormal stress test 03/21/2016  . Blood glucose elevated 08/27/2017  . CAD (coronary artery disease), native coronary artery 06/13/2015   Overview:  Cardiac cath 03/13/16: Diagnostic Summary Severe native multivessel disease as described below Patent LIMA to LAD Patent SVG to PDA Occluded SVG to OM/OM2 ( jump graft) Occluded SVG to Diagonal Normal LV function Diagnostic Recommendations PCI to the Proximal LCX Medical therapy for the other disease Interventional Summary Successful PCI to the proximaL LCX with Xience 3.0 x 28 post dilated with 3.5 x 15 [place] euphora  . Cardiomegaly 08/27/2017  . Chronic anticoagulation 12/19/2015   Overview:  Edoxaban started 12/14/15  . Chronic diastolic heart failure ([place]) 06/13/2015  . Dyslipidemia 06/13/2015  . Esophageal reflux 08/27/2017  . Gilbert syndrome 08/27/2017  . High risk medication use 06/13/2015   Overview:  Sotolol  . Hyperlipidemia 08/27/2017  . Hypertensive heart disease with heart failure ([place]) 06/13/2015  . OSA (obstructive sleep apnea) 08/27/2017  . Osteoarthritis 08/27/2017  . Persistent atrial fibrillation ([place]) 06/13/2015  . Rheumatic disease of mitral valve 08/27/2017  . Rheumatic heart failure (congestive) ([place]) 08/27/2017    Past Surgical History:  Procedure Laterality Date  . A FLUTTER ABLATION  a tach/flutter  . CHOLECYSTECTOMY    . COLONOSCOPY W/ POLYPECTOMY     non cancerous polyp. also found hemorrhoids  . CORONARY ANGIOPLASTY WITH STENT PLACEMENT    . CORONARY ARTERY BYPASS GRAFT  2011   with [place] and LAA ligation   . HERNIA REPAIR    . KNEE SURGERY    . PROSTATE SURGERY    . RECTAL SURGERY      Current  Medications: Current Meds  Medication Sig  . amLODipine-benazepril (LOTREL) 5-20 MG capsule Take 1 capsule by mouth 2 (two) times daily.   . furosemide (LASIX) 40 MG tablet TAKE 1 TABLET DAILY OR AS DIRECTED  . loratadine (CLARITIN) 10 MG tablet Take 10 mg by mouth daily.   . metoprolol tartrate (LOPRESSOR) 25 MG tablet TAKE 1 TABLET TWICE A DAY  . Multiple Vitamins-Minerals (MULTIVITAMIN WITH MINERALS) tablet Take 1 tablet daily by mouth.  . nitroGLYCERIN (NITROSTAT) 0.4 MG SL tablet Place 0.4 mg under the tongue every 5 (five) minutes as needed for chest pain.   . potassium chloride (KLOR-CON M10) 10 MEQ tablet Take 1 tablet (10 mEq total) by mouth daily.  . pravastatin (PRAVACHOL) 40 MG tablet Take 40 mg by mouth daily.  . ranitidine (ZANTAC) 300 MG capsule Take 300 mg by mouth 2 (two) times daily.   [name] SAVAYSA 60 MG TABS tablet TAKE 1 TABLET DAILY     Allergies:   Patient has no known allergies.   Social History   Socioeconomic History  . Marital status: Married    Spouse name: Not on file  . Number of children: Not on file  . Years of education: Not on file  . Highest education level: Not on file  Occupational History  . Not on file  Social Needs  . Financial resource strain: Not on file  . Food insecurity:    Worry: Not on file    Inability: Not on file  . Transportation needs:    Medical: Not on file    Non-medical: Not on file  Tobacco Use  . Smoking status: Former Smoker    Packs/day: 0.25    Years: 25.00    Pack years: 6.25    Types: Cigarettes    Last attempt to quit: 2010    Years since quitting: 9.7  . Smokeless tobacco: Former Systems developer    Types: [place] date: 2010  Substance and Sexual Activity  . Alcohol use: No  . Drug use: No  . Sexual activity: Not on file  Lifestyle  . Physical activity:    Days per week: Not on file    Minutes per session: Not on file  . Stress: Not on file  Relationships  . Social connections:    Talks on phone: Not on  file    Gets together: Not on file    Attends religious service: Not on file    Active member of club or organization: Not on file    Attends meetings of clubs or organizations: Not on file    Relationship status: Not on file  Other Topics Concern  . Not on file  Social History Narrative  . Not on file     Family History: The patient's family history includes Diabetes in his mother; Hyperlipidemia in his brother and mother; Hypertension in his brother and father. ROS:   Please see the history of present illness.    All other systems reviewed and are negative.  EKGs/Labs/Other Studies Reviewed:  The following studies were reviewed today:  EKG:  EKG ordered today.  The ekg ordered today demonstrates atrial fibrillation right bundle branch block nonspecific STs  Recent Labs: No results found for requested labs within last 8760 hours.  Recent Lipid Panel No results found for: CHOL, TRIG, HDL, CHOLHDL, VLDL, LDLCALC, LDLDIRECT  Physical Exam:    VS:  BP 126/84 (BP Location: Right Arm, Patient Position: Sitting, Cuff Size: Large)   Pulse 64   Ht 5\' 11"  (1.803 m)   Wt 224 lb 9.6 oz (101.9 kg)   SpO2 94%   BMI 31.33 kg/m     Wt Readings from Last 3 Encounters:  08/21/18 224 lb 9.6 oz (101.9 kg)  10/22/17 231 lb (104.8 kg)  10/17/17 231 lb (104.8 kg)     GEN:  Well nourished, well developed in no acute distress HEENT: Normal NECK: No JVD; No carotid bruits LYMPHATICS: No lymphadenopathy CARDIAC: IrrIrr variable s1  RESPIRATORY:  Clear to auscultation without rales, wheezing or rhonchi  ABDOMEN: Soft, non-tender, non-distended MUSCULOSKELETAL:  No edema; No deformity  SKIN: Warm and dry NEUROLOGIC:  Alert and oriented x 3 PSYCHIATRIC:  Normal affect    Signed, Shirlee More, MD  08/21/2018 9:20 AM    [place]

## 2018-09-07 DIAGNOSIS — G4733 Obstructive sleep apnea (adult) (pediatric): Secondary | ICD-10-CM | POA: Diagnosis not present

## 2018-09-08 DIAGNOSIS — J301 Allergic rhinitis due to pollen: Secondary | ICD-10-CM | POA: Diagnosis not present

## 2018-09-08 DIAGNOSIS — G4733 Obstructive sleep apnea (adult) (pediatric): Secondary | ICD-10-CM | POA: Diagnosis not present

## 2018-09-08 DIAGNOSIS — J4520 Mild intermittent asthma, uncomplicated: Secondary | ICD-10-CM | POA: Diagnosis not present

## 2018-09-09 DIAGNOSIS — N390 Urinary tract infection, site not specified: Secondary | ICD-10-CM | POA: Diagnosis not present

## 2018-09-09 DIAGNOSIS — R7303 Prediabetes: Secondary | ICD-10-CM | POA: Diagnosis not present

## 2018-09-09 DIAGNOSIS — I130 Hypertensive heart and chronic kidney disease with heart failure and stage 1 through stage 4 chronic kidney disease, or unspecified chronic kidney disease: Secondary | ICD-10-CM | POA: Diagnosis not present

## 2018-09-09 DIAGNOSIS — D649 Anemia, unspecified: Secondary | ICD-10-CM | POA: Diagnosis not present

## 2018-09-09 DIAGNOSIS — E785 Hyperlipidemia, unspecified: Secondary | ICD-10-CM | POA: Diagnosis not present

## 2018-09-09 DIAGNOSIS — J449 Chronic obstructive pulmonary disease, unspecified: Secondary | ICD-10-CM | POA: Diagnosis not present

## 2018-09-09 DIAGNOSIS — R197 Diarrhea, unspecified: Secondary | ICD-10-CM | POA: Diagnosis not present

## 2018-09-12 DIAGNOSIS — J069 Acute upper respiratory infection, unspecified: Secondary | ICD-10-CM | POA: Diagnosis not present

## 2018-09-23 DIAGNOSIS — N390 Urinary tract infection, site not specified: Secondary | ICD-10-CM | POA: Diagnosis not present

## 2018-09-27 ENCOUNTER — Other Ambulatory Visit: Payer: Self-pay | Admitting: Cardiology

## 2018-10-12 ENCOUNTER — Other Ambulatory Visit: Payer: Self-pay | Admitting: Cardiology

## 2018-10-24 ENCOUNTER — Other Ambulatory Visit: Payer: Self-pay | Admitting: Cardiology

## 2018-11-12 ENCOUNTER — Telehealth: Payer: Self-pay

## 2018-11-12 MED ORDER — EDOXABAN TOSYLATE 60 MG PO TABS: 60.0000 mg | ORAL_TABLET | Freq: Every day | ORAL | 1 refills | Status: DC

## 2018-11-12 NOTE — Telephone Encounter (Signed)
Rx sent to pharmacy as requested.

## 2018-11-16 DIAGNOSIS — M1711 Unilateral primary osteoarthritis, right knee: Secondary | ICD-10-CM | POA: Diagnosis not present

## 2018-11-20 ENCOUNTER — Ambulatory Visit (INDEPENDENT_AMBULATORY_CARE_PROVIDER_SITE_OTHER): Payer: Medicare Other

## 2018-11-20 ENCOUNTER — Ambulatory Visit (INDEPENDENT_AMBULATORY_CARE_PROVIDER_SITE_OTHER): Payer: Medicare Other | Admitting: Sports Medicine

## 2018-11-20 ENCOUNTER — Encounter: Payer: Self-pay | Admitting: Sports Medicine

## 2018-11-20 VITALS — BP 131/86 | HR 74 | Resp 16 | Ht 71.0 in | Wt 230.0 lb

## 2018-11-20 DIAGNOSIS — M79671 Pain in right foot: Secondary | ICD-10-CM

## 2018-11-20 DIAGNOSIS — I25118 Atherosclerotic heart disease of native coronary artery with other forms of angina pectoris: Secondary | ICD-10-CM

## 2018-11-20 DIAGNOSIS — M79672 Pain in left foot: Secondary | ICD-10-CM

## 2018-11-20 DIAGNOSIS — M779 Enthesopathy, unspecified: Secondary | ICD-10-CM

## 2018-11-20 DIAGNOSIS — G629 Polyneuropathy, unspecified: Secondary | ICD-10-CM

## 2018-11-20 MED ORDER — GABAPENTIN 300 MG PO CAPS: 300.0000 mg | ORAL_CAPSULE | Freq: Every day | ORAL | 3 refills | Status: DC

## 2018-11-20 NOTE — Patient Instructions (Signed)
Recommend new balance shoes 800-900 model series

## 2018-11-20 NOTE — Progress Notes (Addendum)
Subjective: Jason Stark is a 72 y.o. male patient who presents to office for evaluation of bilateral foot pain. Patient complains of progressive pain especially over the last year along the sides of both feet and a burning pain.  Patient reports that the pain is worse in shoes and states that he thinks that he is wearing his shoes down on the sides and that is where the area hurts.  Patient denies any injury or trauma or fall/sprain/any causative factors.   Review of Systems  All other systems reviewed and are negative.    Patient Active Problem List   Diagnosis Date Noted  . Blood glucose elevated 08/27/2017  . Cardiomegaly 08/27/2017  . Esophageal reflux 08/27/2017  . Gilbert syndrome 08/27/2017  . Hyperlipidemia 08/27/2017  . OSA (obstructive sleep apnea) 08/27/2017  . Osteoarthritis 08/27/2017  . Rheumatic disease of mitral valve 08/27/2017  . Rheumatic heart failure (congestive) ([place]) 08/27/2017  . Abnormal stress test 03/21/2016  . Abnormal myocardial perfusion study 03/11/2016  . Chronic anticoagulation 12/19/2015  . CAD (coronary artery disease), native coronary artery 06/13/2015  . Dyslipidemia 06/13/2015  . High risk medication use 06/13/2015  . Hypertensive heart disease with heart failure ([place]) 06/13/2015  . Permanent atrial fibrillation 06/13/2015    Current Outpatient Medications on File Prior to Visit  Medication Sig Dispense Refill  . amLODipine-benazepril (LOTREL) 5-20 MG capsule Take 1 capsule by mouth 2 (two) times daily.     [name] edoxaban (SAVAYSA) 60 MG TABS tablet Take 60 mg by mouth daily. 90 tablet 1  . ezetimibe (ZETIA) 10 MG tablet Take 1 tablet (10 mg total) by mouth daily. 90 tablet 3  . furosemide (LASIX) 40 MG tablet TAKE 1 TABLET DAILY OR AS DIRECTED 90 tablet 2  . KLOR-CON M10 10 MEQ tablet TAKE 1 TABLET DAILY 90 tablet 4  . loratadine (CLARITIN) 10 MG tablet Take 10 mg by mouth daily.     . metoprolol tartrate (LOPRESSOR) 25 MG tablet TAKE 1  TABLET TWICE A DAY 180 tablet 1  . Multiple Vitamins-Minerals (MULTIVITAMIN WITH MINERALS) tablet Take 1 tablet daily by mouth.    . nitroGLYCERIN (NITROSTAT) 0.4 MG SL tablet Place 0.4 mg under the tongue every 5 (five) minutes as needed for chest pain.     . pravastatin (PRAVACHOL) 40 MG tablet Take 40 mg by mouth daily.    . ranitidine (ZANTAC) 300 MG capsule Take 300 mg by mouth 2 (two) times daily.      No current facility-administered medications on file prior to visit.     No Known Allergies  Objective:  General: Alert and oriented x3 in no acute distress  Dermatology: Very small reactive calluses plantar fifth metatarsal heads bilateral.  No open lesions bilateral lower extremities, no webspace macerations, no ecchymosis bilateral, all nails x 10 are well manicured.  Vascular: Dorsalis Pedis and Posterior Tibial pedal pulses palpable, Capillary Fill Time 3 seconds,(+) pedal hair growth bilateral, no edema bilateral lower extremities, Temperature gradient within normal limits.  Neurology: Gross sensation intact via light touch bilateral.  Mild decreased vibratory sensation bilateral.  Subjective burning to both feet. Musculoskeletal: Mild tenderness with palpation at fifth metatarsal base bilateral, varus foot type, strength within normal limits in all groups bilateral.   Gait: Non-antalgic gait  Xrays  Left and right foot   Impression: Normal osseous mineralization there are no acute fractures, no acute dislocations, soft tissue swelling within normal limits.  No other acute findings.  Assessment and Plan: Problem List  Items Addressed This Visit    None    Visit Diagnoses    Bilateral foot pain    -  Primary   Relevant Orders   DG Foot Complete Right   DG Foot Complete Left   Neuropathy       Tendonitis           -Complete examination performed -Xrays reviewed -Discussed treatement options for tendinitis from increased varus/lateral overload and likely underlying  neuropathy -Rx Gabapentin 300mg  [facility] -Patient declines steroid injection -Advised patient to try good supportive shoes and over-the-counter insoles offered power steps however patient declined -Recommend good skin creams for dry callus skin -Patient to return to office as needed or sooner if condition worsens.  Landis Martins, DPM

## 2018-11-20 NOTE — Progress Notes (Signed)
   Subjective:    Patient ID: Jason Stark, male    DOB: 1946-10-10, 72 y.o.   MRN: [id number]  HPI    Review of Systems  All other systems reviewed and are negative.      Objective:   Physical Exam        Assessment & Plan:

## 2018-11-27 ENCOUNTER — Other Ambulatory Visit: Payer: Self-pay | Admitting: Sports Medicine

## 2018-11-27 DIAGNOSIS — G629 Polyneuropathy, unspecified: Secondary | ICD-10-CM

## 2018-11-27 DIAGNOSIS — M79671 Pain in right foot: Secondary | ICD-10-CM

## 2018-11-27 DIAGNOSIS — M779 Enthesopathy, unspecified: Secondary | ICD-10-CM

## 2018-11-27 DIAGNOSIS — M79672 Pain in left foot: Principal | ICD-10-CM

## 2018-12-08 DIAGNOSIS — M1711 Unilateral primary osteoarthritis, right knee: Secondary | ICD-10-CM | POA: Diagnosis not present

## 2018-12-08 DIAGNOSIS — R7303 Prediabetes: Secondary | ICD-10-CM | POA: Diagnosis not present

## 2018-12-14 DIAGNOSIS — G4733 Obstructive sleep apnea (adult) (pediatric): Secondary | ICD-10-CM | POA: Diagnosis not present

## 2018-12-15 DIAGNOSIS — J4520 Mild intermittent asthma, uncomplicated: Secondary | ICD-10-CM | POA: Diagnosis not present

## 2018-12-15 DIAGNOSIS — J301 Allergic rhinitis due to pollen: Secondary | ICD-10-CM | POA: Diagnosis not present

## 2018-12-15 DIAGNOSIS — G4733 Obstructive sleep apnea (adult) (pediatric): Secondary | ICD-10-CM | POA: Diagnosis not present

## 2018-12-30 DIAGNOSIS — Z01810 Encounter for preprocedural cardiovascular examination: Secondary | ICD-10-CM | POA: Insufficient documentation

## 2018-12-30 HISTORY — DX: Encounter for preprocedural cardiovascular examination: Z01.810

## 2018-12-30 NOTE — Progress Notes (Addendum)
Cardiology Office Note:    Date:  12/31/2018   ID:  Jason Stark, DOB 1946/04/18, MRN [id number]  PCP:  Renaldo Reel, PA  Cardiologist:  Shirlee More, MD    Referring MD: Renaldo Reel, PA   His echocardiogram is reassuring left ventricular function is normal mild valvular regurgitation and despite a diagnosis of rheumatic heart disease it is not present.  I will advised him to proceed with his planned surgery.   ASSESSMENT:    1. Chronic anticoagulation   2. Preoperative cardiovascular examination   3. Permanent atrial fibrillation   4. Hypertensive heart disease with heart failure ([place])   5. Coronary artery disease of native artery of native heart with stable angina pectoris ([place])    PLAN:    In order of problems listed above:  1. He can withdraw his anticoagulant perioperatively I would have him discontinue 3 days 3 days  prior to surgery and I will asked the surgical team to decide when he can resume it postoperatively usually in the timeframe of 2 to 3 days.  He does not require bridging anticoagulation.  He can also discontinue aspirin perioperatively 1 week prior to surgery 2. Planned procedure is not high risk and selective and the patient's heart disease including coronary disease heart failure atrial fibrillation are stable.  It is been many years no other preoperative echocardiogram performed the next 2 weeks and anticipate that will be reassuring and I will make addendum to this note and sent to Dr. Jefferson Fuel. 3. Stable rate controlled anticoagulated 4. Stable continue current treatment including diuretic beta-blocker and long-acting calcium channel blocker 5. Stable CAD New York Heart Association class I he does not require preoperative ischemia evaluation 6. Stable dyslipidemia continue statin through the operative.   Next appointment: 3 mos   Medication Adjustments/Labs and Tests Ordered: Current medicines are reviewed at length with the patient today.   Concerns regarding medicines are outlined above.  No orders of the defined types were placed in this encounter.  No orders of the defined types were placed in this encounter.   Chief Complaint  Patient presents with  . Pre-op Exam    History of Present Illness:    Jason Stark is a 73 y.o. male with a hx of  CAD, CHF, Atrial Fibrillation, Dyslipidemia, S/P CABG, S/P PCI 03/13/16 PCI to the proximaL LCX with Xience  stent   last seen 08/21/18.[name] Compliance with diet, lifestyle and medications: Yes  He is anticipating right total knee arthroplasty Dr. Jefferson Fuel [facility] he has an appointment 01/13/2019.  He has severe pain and limitation but still has an exercise tolerance greater than 5-6 METS.  He is not having edema orthopnea shortness of breath chest pain palpitation or syncope.  His coronary artery disease is stable atrial fibrillation is rate controlled and heart failure is compensated.  On review of his chart he is a history rheumatic mitral valve disease there is no echocardiogram that I can see he has no murmur on examination but I did ask him to have an echocardiogram performed in the next 2 weeks I do not think he has any significant valvular heart disease in that case he can be withdrawn from his anticoagulant and have surgery.  He should go to a monitored bed postoperatively check EKG postoperative day 1 he has a pattern of right bundle branch block and I anticipate an uncomplicated course. Past Medical History:  Diagnosis Date  . Abnormal myocardial perfusion study 03/11/2016  Overview:  Extensive lateral ischemia , [name] 44% in distribution of SVG to [facility] and M  . Abnormal stress test 03/21/2016  . Blood glucose elevated 08/27/2017  . CAD (coronary artery disease), native coronary artery 06/13/2015   Overview:  Cardiac cath 03/13/16: Diagnostic Summary Severe native multivessel disease as described below Patent LIMA to LAD Patent SVG to PDA Occluded SVG to OM/OM2 ( jump  graft) Occluded SVG to Diagonal Normal LV function Diagnostic Recommendations PCI to the Proximal LCX Medical therapy for the other disease Interventional Summary Successful PCI to the proximaL LCX with Xience 3.0 x 28 post dilated with 3.5 x 15 [place] euphora  . Cardiomegaly 08/27/2017  . Chronic anticoagulation 12/19/2015   Overview:  Edoxaban started 12/14/15  . Chronic diastolic heart failure ([place]) 06/13/2015  . Dyslipidemia 06/13/2015  . Esophageal reflux 08/27/2017  . Gilbert syndrome 08/27/2017  . High risk medication use 06/13/2015   Overview:  Sotolol  . Hyperlipidemia 08/27/2017  . Hypertensive heart disease with heart failure ([place]) 06/13/2015  . OSA (obstructive sleep apnea) 08/27/2017  . Osteoarthritis 08/27/2017  . Persistent atrial fibrillation 06/13/2015  . Rheumatic disease of mitral valve 08/27/2017  . Rheumatic heart failure (congestive) ([place]) 08/27/2017    Past Surgical History:  Procedure Laterality Date  . A FLUTTER ABLATION     a tach/flutter  . CHOLECYSTECTOMY    . COLONOSCOPY W/ POLYPECTOMY     non cancerous polyp. also found hemorrhoids  . CORONARY ANGIOPLASTY WITH STENT PLACEMENT    . CORONARY ARTERY BYPASS GRAFT  2011   with [place] and LAA ligation   . HERNIA REPAIR    . KNEE SURGERY    . PROSTATE SURGERY    . RECTAL SURGERY      Current Medications: Current Meds  Medication Sig  . acetaminophen (TYLENOL 8 HOUR ARTHRITIS PAIN) 650 MG CR tablet Take 1,300 mg by mouth 2 (two) times daily.  [name] amLODipine-benazepril (LOTREL) 5-20 MG capsule Take 1 capsule by mouth 2 (two) times daily.   [name] aspirin EC 81 MG tablet Take 81 mg by mouth daily.  . Cholecalciferol (VITAMIN D-3) 125 MCG (5000 UT) TABS Take 1 tablet by mouth daily.  [name] edoxaban (SAVAYSA) 60 MG TABS tablet Take 60 mg by mouth daily.  [name] ezetimibe (ZETIA) 10 MG tablet Take 10 mg by mouth daily.  . famotidine (PEPCID) 20 MG tablet Take 20 mg by mouth daily.  . Ferrous Gluconate (IRON 27 PO) Take 1 tablet by mouth daily.    . fluticasone (FLONASE) 50 MCG/ACT nasal spray 1 spray by Each Nare route daily as needed for Rhinitis.  . furosemide (LASIX) 40 MG tablet TAKE 1 TABLET DAILY OR AS DIRECTED  . gabapentin (NEURONTIN) 300 MG capsule Take 1 capsule (300 mg total) by mouth at bedtime.  [name] KLOR-CON M10 10 MEQ tablet TAKE 1 TABLET DAILY  . loratadine (CLARITIN) 10 MG tablet Take 10 mg by mouth daily.   . metoprolol tartrate (LOPRESSOR) 25 MG tablet TAKE 1 TABLET TWICE A DAY  . pravastatin (PRAVACHOL) 40 MG tablet Take 40 mg by mouth daily.  . vitamin E 400 UNIT capsule Take 400 Units by mouth daily.     Allergies:   Patient has no known allergies.   Social History   Socioeconomic History  . Marital status: Married    Spouse name: Not on file  . Number of children: Not on file  . Years of education: Not on file  . Highest education level: Not  on file  Occupational History  . Not on file  Social Needs  . Financial resource strain: Not on file  . Food insecurity:    Worry: Not on file    Inability: Not on file  . Transportation needs:    Medical: Not on file    Non-medical: Not on file  Tobacco Use  . Smoking status: Former Smoker    Packs/day: 0.25    Years: 25.00    Pack years: 6.25    Types: Cigarettes    Last attempt to quit: 2010    Years since quitting: 10.0  . Smokeless tobacco: Former Systems developer    Types: [place] date: 2010  Substance and Sexual Activity  . Alcohol use: No  . Drug use: No  . Sexual activity: Not on file  Lifestyle  . Physical activity:    Days per week: Not on file    Minutes per session: Not on file  . Stress: Not on file  Relationships  . Social connections:    Talks on phone: Not on file    Gets together: Not on file    Attends religious service: Not on file    Active member of club or organization: Not on file    Attends meetings of clubs or organizations: Not on file    Relationship status: Not on file  Other Topics Concern  . Not on file  Social  History Narrative  . Not on file     Family History: The patient's family history includes Diabetes in his mother; Hyperlipidemia in his brother and mother; Hypertension in his brother and father. ROS:   Please see the history of present illness.    All other systems reviewed and are negative.  EKGs/Labs/Other Studies Reviewed:    The following studies were reviewed today:  EKG:  EKG ordered today.  The ekg ordered today demonstrates atrial fibrillation controlled rate right bundle branch block  Recent Labs: No results found for requested labs within last 8760 hours.  Recent Lipid Panel No results found for: CHOL, TRIG, HDL, CHOLHDL, VLDL, LDLCALC, LDLDIRECT  Physical Exam:    VS:  BP 122/80 (BP Location: Right Arm, Patient Position: Sitting, Cuff Size: Large)   Pulse 65   Ht 5\' 11"  (1.803 m)   Wt 231 lb 12.8 oz (105.1 kg)   SpO2 96%   BMI 32.33 kg/m     Wt Readings from Last 3 Encounters:  12/31/18 231 lb 12.8 oz (105.1 kg)  11/20/18 230 lb (104.3 kg)  08/21/18 224 lb 9.6 oz (101.9 kg)      GEN:  Well nourished, well developed in no acute distress HEENT: Normal NECK: No JVD; No carotid bruits LYMPHATICS: No lymphadenopathy CARDIAC: IrrIrr variable S1 no murmur, no , rubs, gallops RESPIRATORY:  Clear to auscultation without rales, wheezing or rhonchi  ABDOMEN: Soft, non-tender, non-distended MUSCULOSKELETAL:  No edema; No deformity  SKIN: Warm and dry NEUROLOGIC:  Alert and oriented x 3 PSYCHIATRIC:  Normal affect    Signed, Shirlee More, MD  12/31/2018 8:31 AM    [place]

## 2018-12-31 ENCOUNTER — Encounter: Payer: Self-pay | Admitting: Cardiology

## 2018-12-31 ENCOUNTER — Ambulatory Visit (INDEPENDENT_AMBULATORY_CARE_PROVIDER_SITE_OTHER): Payer: Medicare Other | Admitting: Cardiology

## 2018-12-31 VITALS — BP 122/80 | HR 65 | Ht 71.0 in | Wt 231.8 lb

## 2018-12-31 DIAGNOSIS — I25118 Atherosclerotic heart disease of native coronary artery with other forms of angina pectoris: Secondary | ICD-10-CM

## 2018-12-31 DIAGNOSIS — Z01810 Encounter for preprocedural cardiovascular examination: Secondary | ICD-10-CM

## 2018-12-31 DIAGNOSIS — I110 Hypertensive heart disease with heart failure: Secondary | ICD-10-CM

## 2018-12-31 DIAGNOSIS — Z7901 Long term (current) use of anticoagulants: Secondary | ICD-10-CM

## 2018-12-31 DIAGNOSIS — I4821 Permanent atrial fibrillation: Secondary | ICD-10-CM

## 2018-12-31 NOTE — Patient Instructions (Addendum)
Medication Instructions:  Your physician recommends that you continue on your current medications as directed. Please refer to the Current Medication list given to you today.  If you need a refill on your cardiac medications before your next appointment, please call your pharmacy.   Lab work: None  If you have labs (blood work) drawn today and your tests are completely normal, you will receive your results only by: . MyChart Message (if you have MyChart) OR . A paper copy in the mail If you have any lab test that is abnormal or we need to change your treatment, we will call you to review the results.  Testing/Procedures: You had an EKG today.   Your physician has requested that you have an echocardiogram. Echocardiography is a painless test that uses sound waves to create images of your heart. It provides your doctor with information about the size and shape of your heart and how well your heart's chambers and valves are working. This procedure takes approximately one hour. There are no restrictions for this procedure.  Follow-Up: At [facility], you and your health needs are our priority.  As part of our continuing mission to provide you with exceptional heart care, we have created designated Provider Care Teams.  These Care Teams include your primary Cardiologist (physician) and Advanced Practice Providers (APPs -  Physician Assistants and Nurse Practitioners) who all work together to provide you with the care you need, when you need it. You will need a follow up appointment in 6 months.  Please call our office 2 months in advance to schedule this appointment.      Echocardiogram An echocardiogram is a procedure that uses painless sound waves (ultrasound) to produce an image of the heart. Images from an echocardiogram can provide important information about:  Signs of coronary artery disease (CAD).  Aneurysm detection. An aneurysm is a weak or damaged part of an artery wall that  bulges out from the normal force of blood pumping through the body.  Heart size and shape. Changes in the size or shape of the heart can be associated with certain conditions, including heart failure, aneurysm, and CAD.  Heart muscle function.  Heart valve function.  Signs of a past heart attack.  Fluid buildup around the heart.  Thickening of the heart muscle.  A tumor or infectious growth around the heart valves. Tell a health care provider about:  Any allergies you have.  All medicines you are taking, including vitamins, herbs, eye drops, creams, and over-the-counter medicines.  Any blood disorders you have.  Any surgeries you have had.  Any medical conditions you have.  Whether you are pregnant or may be pregnant. What are the risks? Generally, this is a safe procedure. However, problems may occur, including:  Allergic reaction to dye (contrast) that may be used during the procedure. What happens before the procedure? No specific preparation is needed. You may eat and drink normally. What happens during the procedure?   An IV tube may be inserted into one of your veins.  You may receive contrast through this tube. A contrast is an injection that improves the quality of the pictures from your heart.  A gel will be applied to your chest.  A wand-like tool (transducer) will be moved over your chest. The gel will help to transmit the sound waves from the transducer.  The sound waves will harmlessly bounce off of your heart to allow the heart images to be captured in real-time motion. The images   will be recorded on a computer. The procedure may vary among health care providers and hospitals. What happens after the procedure?  You may return to your normal, everyday life, including diet, activities, and medicines, unless your health care provider tells you not to do that. Summary  An echocardiogram is a procedure that uses painless sound waves (ultrasound) to produce  an image of the heart.  Images from an echocardiogram can provide important information about the size and shape of your heart, heart muscle function, heart valve function, and fluid buildup around your heart.  You do not need to do anything to prepare before this procedure. You may eat and drink normally.  After the echocardiogram is completed, you may return to your normal, everyday life, unless your health care provider tells you not to do that. This information is not intended to replace advice given to you by your health care provider. Make sure you discuss any questions you have with your health care provider. Document Released: 11/22/2000 Document Revised: 12/28/2016 Document Reviewed: 12/28/2016 Elsevier Interactive Patient Education  2019 Elsevier Inc.    

## 2019-01-01 ENCOUNTER — Ambulatory Visit (INDEPENDENT_AMBULATORY_CARE_PROVIDER_SITE_OTHER): Payer: Medicare Other

## 2019-01-01 DIAGNOSIS — I4821 Permanent atrial fibrillation: Secondary | ICD-10-CM | POA: Diagnosis not present

## 2019-01-01 DIAGNOSIS — I110 Hypertensive heart disease with heart failure: Secondary | ICD-10-CM

## 2019-01-01 DIAGNOSIS — Z01810 Encounter for preprocedural cardiovascular examination: Secondary | ICD-10-CM

## 2019-01-01 NOTE — Progress Notes (Signed)
Complete echocardiogram has been performed.  Jimmy Cassia Fein RDCS, RVT 

## 2019-01-13 DIAGNOSIS — M25562 Pain in left knee: Secondary | ICD-10-CM | POA: Diagnosis not present

## 2019-01-13 DIAGNOSIS — R936 Abnormal findings on diagnostic imaging of limbs: Secondary | ICD-10-CM | POA: Diagnosis not present

## 2019-01-13 DIAGNOSIS — M8588 Other specified disorders of bone density and structure, other site: Secondary | ICD-10-CM | POA: Diagnosis not present

## 2019-01-13 DIAGNOSIS — M25462 Effusion, left knee: Secondary | ICD-10-CM | POA: Diagnosis not present

## 2019-01-13 DIAGNOSIS — M25461 Effusion, right knee: Secondary | ICD-10-CM | POA: Diagnosis not present

## 2019-01-13 DIAGNOSIS — M11261 Other chondrocalcinosis, right knee: Secondary | ICD-10-CM | POA: Diagnosis not present

## 2019-01-13 DIAGNOSIS — M1711 Unilateral primary osteoarthritis, right knee: Secondary | ICD-10-CM | POA: Diagnosis not present

## 2019-01-13 DIAGNOSIS — I999 Unspecified disorder of circulatory system: Secondary | ICD-10-CM | POA: Diagnosis not present

## 2019-01-13 DIAGNOSIS — M25561 Pain in right knee: Secondary | ICD-10-CM | POA: Diagnosis not present

## 2019-01-13 DIAGNOSIS — R799 Abnormal finding of blood chemistry, unspecified: Secondary | ICD-10-CM | POA: Diagnosis not present

## 2019-01-14 DIAGNOSIS — H2513 Age-related nuclear cataract, bilateral: Secondary | ICD-10-CM | POA: Diagnosis not present

## 2019-01-14 DIAGNOSIS — H524 Presbyopia: Secondary | ICD-10-CM | POA: Diagnosis not present

## 2019-01-22 ENCOUNTER — Ambulatory Visit (INDEPENDENT_AMBULATORY_CARE_PROVIDER_SITE_OTHER): Payer: Medicare Other | Admitting: Sports Medicine

## 2019-01-22 ENCOUNTER — Encounter: Payer: Self-pay | Admitting: Sports Medicine

## 2019-01-22 DIAGNOSIS — M79672 Pain in left foot: Secondary | ICD-10-CM | POA: Diagnosis not present

## 2019-01-22 DIAGNOSIS — M79671 Pain in right foot: Secondary | ICD-10-CM

## 2019-01-22 DIAGNOSIS — I25118 Atherosclerotic heart disease of native coronary artery with other forms of angina pectoris: Secondary | ICD-10-CM

## 2019-01-22 DIAGNOSIS — G629 Polyneuropathy, unspecified: Secondary | ICD-10-CM

## 2019-01-22 NOTE — Patient Instructions (Signed)
Recommend [place] feet cream OTC for dry cracking skin on bottoms of both feet

## 2019-01-22 NOTE — Progress Notes (Signed)
Subjective: Jason Stark is a 73 y.o. male patient who returns to office for medication check. Denies any burning pain since being on Gabapentin.  Reports that he wants me to take a look at the outside of his left foot states that it seems like it is sore from rubbing on his shoe and he is not sure if it is from him walking differently because his right knee has been hurting.  Patient denies any other pedal complaints at this time or constitutional symptoms.   Patient Active Problem List   Diagnosis Date Noted  . Preoperative cardiovascular examination 12/30/2018  . Blood glucose elevated 08/27/2017  . Cardiomegaly 08/27/2017  . Esophageal reflux 08/27/2017  . Gilbert syndrome 08/27/2017  . Hyperlipidemia 08/27/2017  . OSA (obstructive sleep apnea) 08/27/2017  . Osteoarthritis 08/27/2017  . Rheumatic disease of mitral valve 08/27/2017  . Abnormal stress test 03/21/2016  . Abnormal myocardial perfusion study 03/11/2016  . Chronic anticoagulation 12/19/2015  . CAD (coronary artery disease), native coronary artery 06/13/2015  . Dyslipidemia 06/13/2015  . High risk medication use 06/13/2015  . Hypertensive heart disease with heart failure ([place]) 06/13/2015  . Permanent atrial fibrillation 06/13/2015    Current Outpatient Medications on File Prior to Visit  Medication Sig Dispense Refill  . acetaminophen (TYLENOL 8 HOUR ARTHRITIS PAIN) 650 MG CR tablet Take 1,300 mg by mouth 2 (two) times daily.    [name] amLODipine-benazepril (LOTREL) 5-20 MG capsule Take 1 capsule by mouth 2 (two) times daily.     [name] aspirin EC 81 MG tablet Take 81 mg by mouth daily.    . Cholecalciferol (VITAMIN D-3) 125 MCG (5000 UT) TABS Take 1 tablet by mouth daily.    [name] edoxaban (SAVAYSA) 60 MG TABS tablet Take 60 mg by mouth daily. 90 tablet 1  . ezetimibe (ZETIA) 10 MG tablet Take 10 mg by mouth daily.    . famotidine (PEPCID) 20 MG tablet Take 20 mg by mouth daily.    . Ferrous Gluconate (IRON 27 PO) Take 1  tablet by mouth daily.    . fluticasone (FLONASE) 50 MCG/ACT nasal spray 1 spray by Each Nare route daily as needed for Rhinitis.    . furosemide (LASIX) 40 MG tablet TAKE 1 TABLET DAILY OR AS DIRECTED 90 tablet 2  . gabapentin (NEURONTIN) 300 MG capsule Take 1 capsule (300 mg total) by mouth at bedtime. 90 capsule 3  . KLOR-CON M10 10 MEQ tablet TAKE 1 TABLET DAILY 90 tablet 4  . loratadine (CLARITIN) 10 MG tablet Take 10 mg by mouth daily.     . metoprolol tartrate (LOPRESSOR) 25 MG tablet TAKE 1 TABLET TWICE A DAY 180 tablet 1  . pravastatin (PRAVACHOL) 40 MG tablet Take 40 mg by mouth daily.    . vitamin E 400 UNIT capsule Take 400 Units by mouth daily.     No current facility-administered medications on file prior to visit.     No Known Allergies  Objective:  General: Alert and oriented x3 in no acute distress  Dermatology: Very small reactive calluses plantar fifth metatarsal heads bilateral.  No open lesions bilateral lower extremities, no webspace macerations, no ecchymosis bilateral, all nails x 10 are well manicured.  Vascular: Dorsalis Pedis and Posterior Tibial pedal pulses palpable, Capillary Fill Time 3 seconds,(+) pedal hair growth bilateral, no edema bilateral lower extremities, Temperature gradient within normal limits.  Neurology: Gross sensation intact via light touch bilateral.  Mild decreased vibratory sensation bilateral.  Subjective burning to both  feet. Musculoskeletal: Mild tenderness with palpation at fifth metatarsal base bilateral, varus foot type, strength within normal limits in all groups bilateral.   Musculoskeletal: Cavovarus foot type with prominence at styloid process.  No pain to palpation bilateral.  Assessment and Plan: Problem List Items Addressed This Visit    None    Visit Diagnoses    Neuropathy    -  Primary   Bilateral foot pain           -Complete examination performed -Re-Discussed treatement options for tendinitis from increased  varus/lateral overload and likely underlying neuropathy -Continue with gabapentin 300mg  Qhs -Recommend [place] healthy feet for callus areas -Advised patient to continue with good supportive shoes and over-the-counter insoles with use of moleskin as I provided at this visit -Patient to return to office as needed or sooner if condition worsens.  Landis Martins, DPM

## 2019-02-07 HISTORY — PX: TOTAL KNEE ARTHROPLASTY: SHX125

## 2019-02-22 DIAGNOSIS — E669 Obesity, unspecified: Secondary | ICD-10-CM | POA: Diagnosis present

## 2019-02-22 DIAGNOSIS — Z6834 Body mass index (BMI) 34.0-34.9, adult: Secondary | ICD-10-CM | POA: Diagnosis not present

## 2019-02-22 DIAGNOSIS — E785 Hyperlipidemia, unspecified: Secondary | ICD-10-CM | POA: Diagnosis present

## 2019-02-22 DIAGNOSIS — Z7982 Long term (current) use of aspirin: Secondary | ICD-10-CM | POA: Diagnosis not present

## 2019-02-22 DIAGNOSIS — I2510 Atherosclerotic heart disease of native coronary artery without angina pectoris: Secondary | ICD-10-CM | POA: Diagnosis not present

## 2019-02-22 DIAGNOSIS — I4892 Unspecified atrial flutter: Secondary | ICD-10-CM | POA: Diagnosis present

## 2019-02-22 DIAGNOSIS — I509 Heart failure, unspecified: Secondary | ICD-10-CM | POA: Diagnosis present

## 2019-02-22 DIAGNOSIS — I25810 Atherosclerosis of coronary artery bypass graft(s) without angina pectoris: Secondary | ICD-10-CM | POA: Diagnosis present

## 2019-02-22 DIAGNOSIS — G8918 Other acute postprocedural pain: Secondary | ICD-10-CM | POA: Diagnosis not present

## 2019-02-22 DIAGNOSIS — G4733 Obstructive sleep apnea (adult) (pediatric): Secondary | ICD-10-CM | POA: Diagnosis not present

## 2019-02-22 DIAGNOSIS — Z471 Aftercare following joint replacement surgery: Secondary | ICD-10-CM | POA: Diagnosis not present

## 2019-02-22 DIAGNOSIS — Z87891 Personal history of nicotine dependence: Secondary | ICD-10-CM | POA: Diagnosis not present

## 2019-02-22 DIAGNOSIS — K219 Gastro-esophageal reflux disease without esophagitis: Secondary | ICD-10-CM | POA: Diagnosis not present

## 2019-02-22 DIAGNOSIS — I4510 Unspecified right bundle-branch block: Secondary | ICD-10-CM | POA: Diagnosis present

## 2019-02-22 DIAGNOSIS — I482 Chronic atrial fibrillation, unspecified: Secondary | ICD-10-CM | POA: Diagnosis present

## 2019-02-22 DIAGNOSIS — I110 Hypertensive heart disease with heart failure: Secondary | ICD-10-CM | POA: Diagnosis present

## 2019-02-22 DIAGNOSIS — Z79899 Other long term (current) drug therapy: Secondary | ICD-10-CM | POA: Diagnosis not present

## 2019-02-22 DIAGNOSIS — Z9861 Coronary angioplasty status: Secondary | ICD-10-CM | POA: Diagnosis not present

## 2019-02-22 DIAGNOSIS — M1711 Unilateral primary osteoarthritis, right knee: Secondary | ICD-10-CM | POA: Diagnosis not present

## 2019-02-22 DIAGNOSIS — R7303 Prediabetes: Secondary | ICD-10-CM | POA: Diagnosis not present

## 2019-02-22 DIAGNOSIS — I10 Essential (primary) hypertension: Secondary | ICD-10-CM | POA: Diagnosis not present

## 2019-02-22 DIAGNOSIS — Z96651 Presence of right artificial knee joint: Secondary | ICD-10-CM | POA: Diagnosis not present

## 2019-02-22 DIAGNOSIS — Z9989 Dependence on other enabling machines and devices: Secondary | ICD-10-CM | POA: Diagnosis not present

## 2019-02-22 DIAGNOSIS — I499 Cardiac arrhythmia, unspecified: Secondary | ICD-10-CM | POA: Diagnosis not present

## 2019-02-25 DIAGNOSIS — I110 Hypertensive heart disease with heart failure: Secondary | ICD-10-CM | POA: Diagnosis not present

## 2019-02-25 DIAGNOSIS — Z7982 Long term (current) use of aspirin: Secondary | ICD-10-CM | POA: Diagnosis not present

## 2019-02-25 DIAGNOSIS — Z471 Aftercare following joint replacement surgery: Secondary | ICD-10-CM | POA: Diagnosis not present

## 2019-02-25 DIAGNOSIS — Z951 Presence of aortocoronary bypass graft: Secondary | ICD-10-CM | POA: Diagnosis not present

## 2019-02-25 DIAGNOSIS — E669 Obesity, unspecified: Secondary | ICD-10-CM | POA: Diagnosis not present

## 2019-02-25 DIAGNOSIS — I058 Other rheumatic mitral valve diseases: Secondary | ICD-10-CM | POA: Diagnosis not present

## 2019-02-25 DIAGNOSIS — Z9181 History of falling: Secondary | ICD-10-CM | POA: Diagnosis not present

## 2019-02-25 DIAGNOSIS — Z96653 Presence of artificial knee joint, bilateral: Secondary | ICD-10-CM | POA: Diagnosis not present

## 2019-02-25 DIAGNOSIS — R7303 Prediabetes: Secondary | ICD-10-CM | POA: Diagnosis not present

## 2019-02-25 DIAGNOSIS — Z87891 Personal history of nicotine dependence: Secondary | ICD-10-CM | POA: Diagnosis not present

## 2019-02-25 DIAGNOSIS — E785 Hyperlipidemia, unspecified: Secondary | ICD-10-CM | POA: Diagnosis not present

## 2019-02-25 DIAGNOSIS — Z6834 Body mass index (BMI) 34.0-34.9, adult: Secondary | ICD-10-CM | POA: Diagnosis not present

## 2019-02-25 DIAGNOSIS — I509 Heart failure, unspecified: Secondary | ICD-10-CM | POA: Diagnosis not present

## 2019-02-25 DIAGNOSIS — G4733 Obstructive sleep apnea (adult) (pediatric): Secondary | ICD-10-CM | POA: Diagnosis not present

## 2019-02-25 DIAGNOSIS — I4891 Unspecified atrial fibrillation: Secondary | ICD-10-CM | POA: Diagnosis not present

## 2019-02-25 DIAGNOSIS — I4510 Unspecified right bundle-branch block: Secondary | ICD-10-CM | POA: Diagnosis not present

## 2019-02-26 DIAGNOSIS — I4510 Unspecified right bundle-branch block: Secondary | ICD-10-CM | POA: Diagnosis not present

## 2019-02-26 DIAGNOSIS — I110 Hypertensive heart disease with heart failure: Secondary | ICD-10-CM | POA: Diagnosis not present

## 2019-02-26 DIAGNOSIS — I058 Other rheumatic mitral valve diseases: Secondary | ICD-10-CM | POA: Diagnosis not present

## 2019-02-26 DIAGNOSIS — Z471 Aftercare following joint replacement surgery: Secondary | ICD-10-CM | POA: Diagnosis not present

## 2019-02-26 DIAGNOSIS — I4891 Unspecified atrial fibrillation: Secondary | ICD-10-CM | POA: Diagnosis not present

## 2019-02-26 DIAGNOSIS — I509 Heart failure, unspecified: Secondary | ICD-10-CM | POA: Diagnosis not present

## 2019-02-27 DIAGNOSIS — I509 Heart failure, unspecified: Secondary | ICD-10-CM | POA: Diagnosis not present

## 2019-02-27 DIAGNOSIS — I4510 Unspecified right bundle-branch block: Secondary | ICD-10-CM | POA: Diagnosis not present

## 2019-02-27 DIAGNOSIS — I058 Other rheumatic mitral valve diseases: Secondary | ICD-10-CM | POA: Diagnosis not present

## 2019-02-27 DIAGNOSIS — I4891 Unspecified atrial fibrillation: Secondary | ICD-10-CM | POA: Diagnosis not present

## 2019-02-27 DIAGNOSIS — I110 Hypertensive heart disease with heart failure: Secondary | ICD-10-CM | POA: Diagnosis not present

## 2019-02-27 DIAGNOSIS — Z471 Aftercare following joint replacement surgery: Secondary | ICD-10-CM | POA: Diagnosis not present

## 2019-03-01 DIAGNOSIS — I4891 Unspecified atrial fibrillation: Secondary | ICD-10-CM | POA: Diagnosis not present

## 2019-03-01 DIAGNOSIS — I509 Heart failure, unspecified: Secondary | ICD-10-CM | POA: Diagnosis not present

## 2019-03-01 DIAGNOSIS — Z471 Aftercare following joint replacement surgery: Secondary | ICD-10-CM | POA: Diagnosis not present

## 2019-03-01 DIAGNOSIS — I4510 Unspecified right bundle-branch block: Secondary | ICD-10-CM | POA: Diagnosis not present

## 2019-03-01 DIAGNOSIS — I110 Hypertensive heart disease with heart failure: Secondary | ICD-10-CM | POA: Diagnosis not present

## 2019-03-01 DIAGNOSIS — I058 Other rheumatic mitral valve diseases: Secondary | ICD-10-CM | POA: Diagnosis not present

## 2019-03-04 DIAGNOSIS — Z471 Aftercare following joint replacement surgery: Secondary | ICD-10-CM | POA: Diagnosis not present

## 2019-03-04 DIAGNOSIS — I110 Hypertensive heart disease with heart failure: Secondary | ICD-10-CM | POA: Diagnosis not present

## 2019-03-04 DIAGNOSIS — I4510 Unspecified right bundle-branch block: Secondary | ICD-10-CM | POA: Diagnosis not present

## 2019-03-04 DIAGNOSIS — I509 Heart failure, unspecified: Secondary | ICD-10-CM | POA: Diagnosis not present

## 2019-03-04 DIAGNOSIS — I058 Other rheumatic mitral valve diseases: Secondary | ICD-10-CM | POA: Diagnosis not present

## 2019-03-04 DIAGNOSIS — I4891 Unspecified atrial fibrillation: Secondary | ICD-10-CM | POA: Diagnosis not present

## 2019-03-09 DIAGNOSIS — I058 Other rheumatic mitral valve diseases: Secondary | ICD-10-CM | POA: Diagnosis not present

## 2019-03-09 DIAGNOSIS — I509 Heart failure, unspecified: Secondary | ICD-10-CM | POA: Diagnosis not present

## 2019-03-09 DIAGNOSIS — Z471 Aftercare following joint replacement surgery: Secondary | ICD-10-CM | POA: Diagnosis not present

## 2019-03-09 DIAGNOSIS — I4891 Unspecified atrial fibrillation: Secondary | ICD-10-CM | POA: Diagnosis not present

## 2019-03-09 DIAGNOSIS — I110 Hypertensive heart disease with heart failure: Secondary | ICD-10-CM | POA: Diagnosis not present

## 2019-03-09 DIAGNOSIS — I4510 Unspecified right bundle-branch block: Secondary | ICD-10-CM | POA: Diagnosis not present

## 2019-03-12 DIAGNOSIS — Z471 Aftercare following joint replacement surgery: Secondary | ICD-10-CM | POA: Diagnosis not present

## 2019-03-12 DIAGNOSIS — I4891 Unspecified atrial fibrillation: Secondary | ICD-10-CM | POA: Diagnosis not present

## 2019-03-12 DIAGNOSIS — I4510 Unspecified right bundle-branch block: Secondary | ICD-10-CM | POA: Diagnosis not present

## 2019-03-12 DIAGNOSIS — I509 Heart failure, unspecified: Secondary | ICD-10-CM | POA: Diagnosis not present

## 2019-03-12 DIAGNOSIS — I110 Hypertensive heart disease with heart failure: Secondary | ICD-10-CM | POA: Diagnosis not present

## 2019-03-12 DIAGNOSIS — I058 Other rheumatic mitral valve diseases: Secondary | ICD-10-CM | POA: Diagnosis not present

## 2019-03-16 DIAGNOSIS — Z471 Aftercare following joint replacement surgery: Secondary | ICD-10-CM | POA: Diagnosis not present

## 2019-03-16 DIAGNOSIS — I058 Other rheumatic mitral valve diseases: Secondary | ICD-10-CM | POA: Diagnosis not present

## 2019-03-16 DIAGNOSIS — I509 Heart failure, unspecified: Secondary | ICD-10-CM | POA: Diagnosis not present

## 2019-03-16 DIAGNOSIS — I4510 Unspecified right bundle-branch block: Secondary | ICD-10-CM | POA: Diagnosis not present

## 2019-03-16 DIAGNOSIS — I4891 Unspecified atrial fibrillation: Secondary | ICD-10-CM | POA: Diagnosis not present

## 2019-03-16 DIAGNOSIS — I110 Hypertensive heart disease with heart failure: Secondary | ICD-10-CM | POA: Diagnosis not present

## 2019-03-18 DIAGNOSIS — I110 Hypertensive heart disease with heart failure: Secondary | ICD-10-CM | POA: Diagnosis not present

## 2019-03-18 DIAGNOSIS — I058 Other rheumatic mitral valve diseases: Secondary | ICD-10-CM | POA: Diagnosis not present

## 2019-03-18 DIAGNOSIS — I4510 Unspecified right bundle-branch block: Secondary | ICD-10-CM | POA: Diagnosis not present

## 2019-03-18 DIAGNOSIS — I4891 Unspecified atrial fibrillation: Secondary | ICD-10-CM | POA: Diagnosis not present

## 2019-03-18 DIAGNOSIS — I509 Heart failure, unspecified: Secondary | ICD-10-CM | POA: Diagnosis not present

## 2019-03-18 DIAGNOSIS — Z471 Aftercare following joint replacement surgery: Secondary | ICD-10-CM | POA: Diagnosis not present

## 2019-03-23 DIAGNOSIS — I058 Other rheumatic mitral valve diseases: Secondary | ICD-10-CM | POA: Diagnosis not present

## 2019-03-23 DIAGNOSIS — I4891 Unspecified atrial fibrillation: Secondary | ICD-10-CM | POA: Diagnosis not present

## 2019-03-23 DIAGNOSIS — I110 Hypertensive heart disease with heart failure: Secondary | ICD-10-CM | POA: Diagnosis not present

## 2019-03-23 DIAGNOSIS — I4510 Unspecified right bundle-branch block: Secondary | ICD-10-CM | POA: Diagnosis not present

## 2019-03-23 DIAGNOSIS — Z471 Aftercare following joint replacement surgery: Secondary | ICD-10-CM | POA: Diagnosis not present

## 2019-03-23 DIAGNOSIS — I509 Heart failure, unspecified: Secondary | ICD-10-CM | POA: Diagnosis not present

## 2019-03-25 DIAGNOSIS — I4510 Unspecified right bundle-branch block: Secondary | ICD-10-CM | POA: Diagnosis not present

## 2019-03-25 DIAGNOSIS — I058 Other rheumatic mitral valve diseases: Secondary | ICD-10-CM | POA: Diagnosis not present

## 2019-03-25 DIAGNOSIS — I509 Heart failure, unspecified: Secondary | ICD-10-CM | POA: Diagnosis not present

## 2019-03-25 DIAGNOSIS — Z471 Aftercare following joint replacement surgery: Secondary | ICD-10-CM | POA: Diagnosis not present

## 2019-03-25 DIAGNOSIS — I4891 Unspecified atrial fibrillation: Secondary | ICD-10-CM | POA: Diagnosis not present

## 2019-03-25 DIAGNOSIS — I110 Hypertensive heart disease with heart failure: Secondary | ICD-10-CM | POA: Diagnosis not present

## 2019-03-27 DIAGNOSIS — I4510 Unspecified right bundle-branch block: Secondary | ICD-10-CM | POA: Diagnosis not present

## 2019-03-27 DIAGNOSIS — Z6834 Body mass index (BMI) 34.0-34.9, adult: Secondary | ICD-10-CM | POA: Diagnosis not present

## 2019-03-27 DIAGNOSIS — Z7982 Long term (current) use of aspirin: Secondary | ICD-10-CM | POA: Diagnosis not present

## 2019-03-27 DIAGNOSIS — R7303 Prediabetes: Secondary | ICD-10-CM | POA: Diagnosis not present

## 2019-03-27 DIAGNOSIS — I4891 Unspecified atrial fibrillation: Secondary | ICD-10-CM | POA: Diagnosis not present

## 2019-03-27 DIAGNOSIS — Z96653 Presence of artificial knee joint, bilateral: Secondary | ICD-10-CM | POA: Diagnosis not present

## 2019-03-27 DIAGNOSIS — Z951 Presence of aortocoronary bypass graft: Secondary | ICD-10-CM | POA: Diagnosis not present

## 2019-03-27 DIAGNOSIS — Z87891 Personal history of nicotine dependence: Secondary | ICD-10-CM | POA: Diagnosis not present

## 2019-03-27 DIAGNOSIS — I058 Other rheumatic mitral valve diseases: Secondary | ICD-10-CM | POA: Diagnosis not present

## 2019-03-27 DIAGNOSIS — E669 Obesity, unspecified: Secondary | ICD-10-CM | POA: Diagnosis not present

## 2019-03-27 DIAGNOSIS — G4733 Obstructive sleep apnea (adult) (pediatric): Secondary | ICD-10-CM | POA: Diagnosis not present

## 2019-03-27 DIAGNOSIS — Z471 Aftercare following joint replacement surgery: Secondary | ICD-10-CM | POA: Diagnosis not present

## 2019-03-27 DIAGNOSIS — I509 Heart failure, unspecified: Secondary | ICD-10-CM | POA: Diagnosis not present

## 2019-03-27 DIAGNOSIS — E785 Hyperlipidemia, unspecified: Secondary | ICD-10-CM | POA: Diagnosis not present

## 2019-03-27 DIAGNOSIS — Z9181 History of falling: Secondary | ICD-10-CM | POA: Diagnosis not present

## 2019-03-27 DIAGNOSIS — I110 Hypertensive heart disease with heart failure: Secondary | ICD-10-CM | POA: Diagnosis not present

## 2019-03-30 DIAGNOSIS — I4510 Unspecified right bundle-branch block: Secondary | ICD-10-CM | POA: Diagnosis not present

## 2019-03-30 DIAGNOSIS — I110 Hypertensive heart disease with heart failure: Secondary | ICD-10-CM | POA: Diagnosis not present

## 2019-03-30 DIAGNOSIS — I4891 Unspecified atrial fibrillation: Secondary | ICD-10-CM | POA: Diagnosis not present

## 2019-03-30 DIAGNOSIS — Z471 Aftercare following joint replacement surgery: Secondary | ICD-10-CM | POA: Diagnosis not present

## 2019-03-30 DIAGNOSIS — I058 Other rheumatic mitral valve diseases: Secondary | ICD-10-CM | POA: Diagnosis not present

## 2019-03-30 DIAGNOSIS — I509 Heart failure, unspecified: Secondary | ICD-10-CM | POA: Diagnosis not present

## 2019-04-01 DIAGNOSIS — I509 Heart failure, unspecified: Secondary | ICD-10-CM | POA: Diagnosis not present

## 2019-04-01 DIAGNOSIS — Z471 Aftercare following joint replacement surgery: Secondary | ICD-10-CM | POA: Diagnosis not present

## 2019-04-01 DIAGNOSIS — I058 Other rheumatic mitral valve diseases: Secondary | ICD-10-CM | POA: Diagnosis not present

## 2019-04-01 DIAGNOSIS — I4510 Unspecified right bundle-branch block: Secondary | ICD-10-CM | POA: Diagnosis not present

## 2019-04-01 DIAGNOSIS — I110 Hypertensive heart disease with heart failure: Secondary | ICD-10-CM | POA: Diagnosis not present

## 2019-04-01 DIAGNOSIS — I4891 Unspecified atrial fibrillation: Secondary | ICD-10-CM | POA: Diagnosis not present

## 2019-04-07 DIAGNOSIS — I999 Unspecified disorder of circulatory system: Secondary | ICD-10-CM | POA: Diagnosis not present

## 2019-04-07 DIAGNOSIS — Z471 Aftercare following joint replacement surgery: Secondary | ICD-10-CM | POA: Diagnosis not present

## 2019-04-07 DIAGNOSIS — Z96651 Presence of right artificial knee joint: Secondary | ICD-10-CM | POA: Diagnosis not present

## 2019-04-07 DIAGNOSIS — M25461 Effusion, right knee: Secondary | ICD-10-CM | POA: Diagnosis not present

## 2019-04-08 DIAGNOSIS — R7303 Prediabetes: Secondary | ICD-10-CM | POA: Diagnosis not present

## 2019-04-08 DIAGNOSIS — J449 Chronic obstructive pulmonary disease, unspecified: Secondary | ICD-10-CM | POA: Diagnosis not present

## 2019-04-08 DIAGNOSIS — D649 Anemia, unspecified: Secondary | ICD-10-CM | POA: Diagnosis not present

## 2019-04-08 DIAGNOSIS — I130 Hypertensive heart and chronic kidney disease with heart failure and stage 1 through stage 4 chronic kidney disease, or unspecified chronic kidney disease: Secondary | ICD-10-CM | POA: Diagnosis not present

## 2019-04-08 DIAGNOSIS — N390 Urinary tract infection, site not specified: Secondary | ICD-10-CM | POA: Diagnosis not present

## 2019-04-08 DIAGNOSIS — R300 Dysuria: Secondary | ICD-10-CM | POA: Diagnosis not present

## 2019-04-08 DIAGNOSIS — Z125 Encounter for screening for malignant neoplasm of prostate: Secondary | ICD-10-CM | POA: Diagnosis not present

## 2019-04-08 DIAGNOSIS — E785 Hyperlipidemia, unspecified: Secondary | ICD-10-CM | POA: Diagnosis not present

## 2019-04-12 DIAGNOSIS — Z9181 History of falling: Secondary | ICD-10-CM | POA: Diagnosis not present

## 2019-04-12 DIAGNOSIS — Z136 Encounter for screening for cardiovascular disorders: Secondary | ICD-10-CM | POA: Diagnosis not present

## 2019-04-12 DIAGNOSIS — Z1331 Encounter for screening for depression: Secondary | ICD-10-CM | POA: Diagnosis not present

## 2019-04-12 DIAGNOSIS — E669 Obesity, unspecified: Secondary | ICD-10-CM | POA: Diagnosis not present

## 2019-04-12 DIAGNOSIS — Z125 Encounter for screening for malignant neoplasm of prostate: Secondary | ICD-10-CM | POA: Diagnosis not present

## 2019-04-12 DIAGNOSIS — Z6831 Body mass index (BMI) 31.0-31.9, adult: Secondary | ICD-10-CM | POA: Diagnosis not present

## 2019-04-12 DIAGNOSIS — Z0000 Encounter for general adult medical examination without abnormal findings: Secondary | ICD-10-CM | POA: Diagnosis not present

## 2019-04-12 DIAGNOSIS — E785 Hyperlipidemia, unspecified: Secondary | ICD-10-CM | POA: Diagnosis not present

## 2019-04-20 DIAGNOSIS — J4520 Mild intermittent asthma, uncomplicated: Secondary | ICD-10-CM | POA: Diagnosis not present

## 2019-04-20 DIAGNOSIS — G4733 Obstructive sleep apnea (adult) (pediatric): Secondary | ICD-10-CM | POA: Diagnosis not present

## 2019-04-20 DIAGNOSIS — J301 Allergic rhinitis due to pollen: Secondary | ICD-10-CM | POA: Diagnosis not present

## 2019-04-22 ENCOUNTER — Other Ambulatory Visit: Payer: Self-pay | Admitting: Cardiology

## 2019-04-22 DIAGNOSIS — N390 Urinary tract infection, site not specified: Secondary | ICD-10-CM | POA: Diagnosis not present

## 2019-04-27 ENCOUNTER — Other Ambulatory Visit: Payer: Self-pay | Admitting: Cardiology

## 2019-04-30 NOTE — Telephone Encounter (Signed)
Received refill request from Pharmacy for [facility], message flagged for createnine clearance reminder/review by provider. Last CMP 10/2018 with BUN 9, Createnine 1.28 and GFR 57.   pls advise, tx

## 2019-05-06 MED ORDER — EDOXABAN TOSYLATE 60 MG PO TABS: ORAL_TABLET | ORAL | 1 refills | Status: DC

## 2019-05-06 NOTE — Addendum Note (Signed)
Addended by: Polly Cobia A on: 05/06/2019 02:24 PM   Modules accepted: Orders

## 2019-05-06 NOTE — Telephone Encounter (Signed)
Is very similar to apixaban and no dose adjustment for renal function down to the point of dialysis

## 2019-05-20 DIAGNOSIS — N3020 Other chronic cystitis without hematuria: Secondary | ICD-10-CM | POA: Diagnosis not present

## 2019-05-20 DIAGNOSIS — R300 Dysuria: Secondary | ICD-10-CM | POA: Diagnosis not present

## 2019-06-03 DIAGNOSIS — M2341 Loose body in knee, right knee: Secondary | ICD-10-CM | POA: Diagnosis not present

## 2019-06-03 DIAGNOSIS — Z96651 Presence of right artificial knee joint: Secondary | ICD-10-CM | POA: Diagnosis not present

## 2019-06-03 DIAGNOSIS — Z471 Aftercare following joint replacement surgery: Secondary | ICD-10-CM | POA: Diagnosis not present

## 2019-06-03 DIAGNOSIS — M25461 Effusion, right knee: Secondary | ICD-10-CM | POA: Diagnosis not present

## 2019-06-16 NOTE — Progress Notes (Signed)
Cardiology Office Note:    Date:  06/17/2019   ID:  Jason Stark, DOB [birth year], MRN [id number]  PCP:  Renaldo Reel, PA  Cardiologist:  Shirlee More, MD    Referring MD: Renaldo Reel, PA    ASSESSMENT:    1. Hypertensive heart disease with heart failure ([place])   2. Permanent atrial fibrillation   3. Chronic anticoagulation   4. Coronary artery disease of native artery of native heart with stable angina pectoris ([place])   5. Hyperlipidemia, unspecified hyperlipidemia type    PLAN:    In order of problems listed above:  1. Heart failure decompensated marked fluid overload I will switch from furosemide to torsemide check baseline renal function proBNP level I asked him to begin to weigh daily and record bring to the office next visit and to fully sodium restrict. 2. Atrial fibrillation stable rate controlled continue his beta-blocker and current anticoagulant 3. Stable continue anticoagulant check renal function regarding dosage 4. Stable CAD [place] Heart Association class I continue medical therapy and at this time I would not advise an ischemia evaluation 5. Stable continue combined pravastatin and Zetia   Next appointment: 4 weeks   Medication Adjustments/Labs and Tests Ordered: Current medicines are reviewed at length with the patient today.  Concerns regarding medicines are outlined above.  Orders Placed This Encounter  Procedures  . Basic Metabolic Panel (BMET)  . Pro b natriuretic peptide (BNP)   Meds ordered this encounter  Medications  . torsemide (DEMADEX) 20 MG tablet    Sig: Take 1 tablet (20 mg total) by mouth 2 (two) times daily.    Dispense:  60 tablet    Refill:  3    Chief Complaint  Patient presents with  . Follow-up  . Coronary Artery Disease  . Congestive Heart Failure  . Atrial Fibrillation  . Hyperlipidemia    History of Present Illness:    Jason Stark is a 73 y.o. male with a hx of CAD, CHF, Atrial Fibrillation,  Dyslipidemia, S/P CABG, S/P PCI 03/13/16 PCI to the proximaL LCX with Xience  stent  last seen 12/31/2018. Compliance with diet, lifestyle and medications: No, unfortunately add salt to his diet  Since his surgery has not done well he is developed marked lower extremity edema but no shortness of breath orthopnea no chest pain palpitation or syncope he struggles with the care of his wife at home with dementia and he said he has had multiple courses of antibiotics for urinary tract infection.  25 minutes was spent with the patient reviewing the results of his echocardiogram diagnosis of decompensated heart failure education regarding sodium restriction Daily weights and formulating a plan for changing his diuretics to handle decompensated heart failure to avoid hospitalization. Past Medical History:  Diagnosis Date  . Abnormal myocardial perfusion study 03/11/2016   Overview:  Extensive lateral ischemia , [name] 44% in distribution of SVG to [facility] and M  . Abnormal stress test 03/21/2016  . Blood glucose elevated 08/27/2017  . CAD (coronary artery disease), native coronary artery 06/13/2015   Overview:  Cardiac cath 03/13/16: Diagnostic Summary Severe native multivessel disease as described below Patent LIMA to LAD Patent SVG to PDA Occluded SVG to OM/OM2 ( jump graft) Occluded SVG to Diagonal Normal LV function Diagnostic Recommendations PCI to the Proximal LCX Medical therapy for the other disease Interventional Summary Successful PCI to the proximaL LCX with Xience 3.0 x 28 post dilated with 3.5 x 15 [place] euphora  . Cardiomegaly  08/27/2017  . Chronic anticoagulation 12/19/2015   Overview:  Edoxaban started 12/14/15  . Chronic diastolic heart failure ([place]) 06/13/2015  . Dyslipidemia 06/13/2015  . Esophageal reflux 08/27/2017  . Gilbert syndrome 08/27/2017  . High risk medication use 06/13/2015   Overview:  Sotolol  . Hyperlipidemia 08/27/2017  . Hypertensive heart disease with heart failure ([place]) 06/13/2015  . OSA  (obstructive sleep apnea) 08/27/2017  . Osteoarthritis 08/27/2017  . Persistent atrial fibrillation 06/13/2015  . Rheumatic disease of mitral valve 08/27/2017  . Rheumatic heart failure (congestive) ([place]) 08/27/2017    Past Surgical History:  Procedure Laterality Date  . A FLUTTER ABLATION     a tach/flutter  . CHOLECYSTECTOMY    . COLONOSCOPY W/ POLYPECTOMY     non cancerous polyp. also found hemorrhoids  . CORONARY ANGIOPLASTY WITH STENT PLACEMENT    . CORONARY ARTERY BYPASS GRAFT  2011   with [place] and LAA ligation   . HERNIA REPAIR    . KNEE SURGERY    . PROSTATE SURGERY    . RECTAL SURGERY    . TOTAL KNEE ARTHROPLASTY Right 02/2019    Current Medications: Current Meds  Medication Sig  . acetaminophen (TYLENOL 8 HOUR ARTHRITIS PAIN) 650 MG CR tablet Take 1,300 mg by mouth 2 (two) times daily.  [name] amLODipine-benazepril (LOTREL) 5-20 MG capsule Take 1 capsule by mouth 2 (two) times daily.   [name] aspirin EC 81 MG tablet Take 81 mg by mouth daily.  . Cholecalciferol (VITAMIN D-3) 125 MCG (5000 UT) TABS Take 1 tablet by mouth daily.  [name] edoxaban (SAVAYSA) 60 MG TABS tablet TAKE 1 TABLET DAILY (WILL PROVIDE MORE REFILLS ONCE THE PATIENT FOLLOW UP)  . ezetimibe (ZETIA) 10 MG tablet Take 10 mg by mouth daily.  . famotidine (PEPCID) 20 MG tablet Take 20 mg by mouth daily.  . Ferrous Gluconate (IRON 27 PO) Take 1 tablet by mouth daily.  . fluticasone (FLONASE) 50 MCG/ACT nasal spray 1 spray by Each Nare route daily as needed for Rhinitis.  [name] gabapentin (NEURONTIN) 300 MG capsule Take 600 mg by mouth at bedtime.  [name] KLOR-CON M10 10 MEQ tablet TAKE 1 TABLET DAILY  . loratadine (CLARITIN) 10 MG tablet Take 10 mg by mouth daily.   . metoprolol tartrate (LOPRESSOR) 25 MG tablet TAKE 1 TABLET TWICE A DAY  . pravastatin (PRAVACHOL) 40 MG tablet Take 40 mg by mouth daily.  . vitamin E 400 UNIT capsule Take 400 Units by mouth daily.  . [DISCONTINUED] furosemide (LASIX) 40 MG tablet TAKE 1 TABLET DAILY  OR AS DIRECTED     Allergies:   Patient has no known allergies.   Social History   Socioeconomic History  . Marital status: Married    Spouse name: Not on file  . Number of children: Not on file  . Years of education: Not on file  . Highest education level: Not on file  Occupational History  . Not on file  Social Needs  . Financial resource strain: Not on file  . Food insecurity    Worry: Not on file    Inability: Not on file  . Transportation needs    Medical: Not on file    Non-medical: Not on file  Tobacco Use  . Smoking status: Former Smoker    Packs/day: 0.25    Years: 25.00    Pack years: 6.25    Types: Cigarettes    Quit date: 2010    Years since quitting: 10.5  .  Smokeless tobacco: Former Systems developer    Types: [place] date: 2011  Substance and Sexual Activity  . Alcohol use: No  . Drug use: No  . Sexual activity: Not on file  Lifestyle  . Physical activity    Days per week: Not on file    Minutes per session: Not on file  . Stress: Not on file  Relationships  . Social Herbalist on phone: Not on file    Gets together: Not on file    Attends religious service: Not on file    Active member of club or organization: Not on file    Attends meetings of clubs or organizations: Not on file    Relationship status: Not on file  Other Topics Concern  . Not on file  Social History Narrative  . Not on file     Family History: The patient's family history includes Diabetes in his mother; Hyperlipidemia in his brother and mother; Hypertension in his brother and father. ROS:   Please see the history of present illness.    All other systems reviewed and are negative.  EKGs/Labs/Other Studies Reviewed:    The following studies were reviewed today:  Echo 01/01/2019:  Study Conclusions - Left ventricle: The cavity size was normal. Wall thickness was   increased in a pattern of moderate LVH. Systolic function was   normal. The estimated ejection  fraction was in the range of 55%   to 60%. Wall motion was normal; there were no regional wall   motion abnormalities. - Aortic valve: There was mild regurgitation. - Mitral valve: There was mild regurgitation. - Left atrium: The atrium was moderately dilated. Impressions: - 1. Preserved systolic function. Moderate concentric LVH.   2. Mild AR, MR and TR.   3. Moderate LA enlargement.   4. Ascending aorta measured at 4.1 cm.   5. Mild IVC dilatation.  EKG:  EKG ordered today and personally reviewed.  The ekg ordered today demonstrates   Recent Labs: No results found for requested labs within last 8760 hours.  Recent Lipid Panel No results found for: CHOL, TRIG, HDL, CHOLHDL, VLDL, LDLCALC, LDLDIRECT  Physical Exam:    VS:  BP (!) 154/88 (BP Location: Left Arm, Patient Position: Sitting, Cuff Size: Normal)   Pulse 60   Temp 98.4 F (36.9 C)   Ht 5\' 11"  (1.803 m)   Wt 234 lb (106.1 kg)   SpO2 92%   BMI 32.64 kg/m     Wt Readings from Last 3 Encounters:  06/17/19 234 lb (106.1 kg)  12/31/18 231 lb 12.8 oz (105.1 kg)  11/20/18 230 lb (104.3 kg)     GEN:  Well nourished, well developed in no acute distress HEENT: Normal NECK: No JVD; No carotid bruits LYMPHATICS: No lymphadenopathy CARDIAC: Irregular rhythm variable first heart sound no murmurs, rubs, gallops RESPIRATORY:  Clear to auscultation without rales, wheezing or rhonchi  ABDOMEN: Soft, non-tender, non-distended MUSCULOSKELETAL: He has greater than 4+ ankle to knee bilateral pitting edema; No deformity  SKIN: Warm and dry NEUROLOGIC:  Alert and oriented x 3 PSYCHIATRIC:  Normal affect    Signed, Shirlee More, MD  06/17/2019 2:32 PM    [facility]

## 2019-06-17 ENCOUNTER — Ambulatory Visit (INDEPENDENT_AMBULATORY_CARE_PROVIDER_SITE_OTHER): Payer: Medicare Other | Admitting: Cardiology

## 2019-06-17 ENCOUNTER — Encounter: Payer: Self-pay | Admitting: Cardiology

## 2019-06-17 ENCOUNTER — Other Ambulatory Visit: Payer: Self-pay

## 2019-06-17 VITALS — BP 154/88 | HR 60 | Temp 98.4°F | Ht 71.0 in | Wt 234.0 lb

## 2019-06-17 DIAGNOSIS — I4821 Permanent atrial fibrillation: Secondary | ICD-10-CM | POA: Diagnosis not present

## 2019-06-17 DIAGNOSIS — I25118 Atherosclerotic heart disease of native coronary artery with other forms of angina pectoris: Secondary | ICD-10-CM

## 2019-06-17 DIAGNOSIS — I110 Hypertensive heart disease with heart failure: Secondary | ICD-10-CM

## 2019-06-17 DIAGNOSIS — Z7901 Long term (current) use of anticoagulants: Secondary | ICD-10-CM | POA: Diagnosis not present

## 2019-06-17 DIAGNOSIS — E785 Hyperlipidemia, unspecified: Secondary | ICD-10-CM

## 2019-06-17 MED ORDER — TORSEMIDE 20 MG PO TABS: 20.0000 mg | ORAL_TABLET | Freq: Two times a day (BID) | ORAL | 3 refills | Status: DC

## 2019-06-17 NOTE — Patient Instructions (Signed)
Medication Instructions:  Your physician has recommended you make the following change in your medication:  STOP furosemide (lasix)   START torsemide (demadex) 20 mg: Take 1 tablet twice daily   If you need a refill on your cardiac medications before your next appointment, please call your pharmacy.   Lab work: Your physician recommends that you return for lab work today: BMP, ProBNP.   If you have labs (blood work) drawn today and your tests are completely normal, you will receive your results only by: [name] MyChart Message (if you have MyChart) OR . A paper copy in the mail If you have any lab test that is abnormal or we need to change your treatment, we will call you to review the results.  Testing/Procedures: None  Follow-Up: At [facility], you and your health needs are our priority.  As part of our continuing mission to provide you with exceptional heart care, we have created designated Provider Care Teams.  These Care Teams include your primary Cardiologist (physician) and Advanced Practice Providers (APPs -  Physician Assistants and Nurse Practitioners) who all work together to provide you with the care you need, when you need it. . You will need a follow up appointment in 4 weeks.    Any Other Special Instructions Will Be Listed Below (If Applicable).  **Weigh yourself daily at the same time every day and keep a log of these readings. Bring this log with you to your next appointment for Dr. Bettina Gavia to review.    Torsemide tablets What is this medicine? TORSEMIDE (TORE se mide) is a diuretic. It helps you make more urine and to lose salt and excess water from your body. This medicine is used to treat high blood pressure, and edema or swelling from heart, kidney, or liver disease. This medicine may be used for other purposes; ask your health care provider or pharmacist if you have questions. COMMON BRAND NAME(S): Demadex What should I tell my health care provider before I take  this medicine? They need to know if you have any of these conditions:  abnormal blood electrolytes  diabetes  gout  heart disease  kidney disease  liver disease  small amounts of urine, or difficulty passing urine  an unusual or allergic reaction to torsemide, sulfa drugs, other medicines, foods, dyes, or preservatives  pregnant or trying to get pregnant  breast-feeding How should I use this medicine? Take this medicine by mouth with a glass of water. Follow the directions on the prescription label. You may take this medicine with or without food. If it upsets your stomach, take it with food or milk. Do not take your medicine more often than directed. Remember that you will need to pass more urine after taking this medicine. Do not take your medicine at a time of day that will cause you problems. Do not take at bedtime. Talk to your pediatrician regarding the use of this medicine in children. Special care may be needed. Overdosage: If you think you have taken too much of this medicine contact a poison control center or emergency room at once. NOTE: This medicine is only for you. Do not share this medicine with others. What if I miss a dose? If you miss a dose, take it as soon as you can. If it is almost time for your next dose, take only that dose. Do not take double or extra doses. What may interact with this medicine?  alcohol  certain antibiotics given by injection certain heart medicines  like digoxin  diuretics  lithium  medicines for diabetes  medicines for blood pressure  medicines for cholesterol like cholestyramine  medicines that relax muscles for surgery  NSAIDs, medicines for pain and inflammation, like ibuprofen or naproxen  OTC supplements like ginseng and ephedra  probenecid  steroid medicines like prednisone or cortisone This list may not describe all possible interactions. Give your health care provider a list of all the medicines, herbs,  non-prescription drugs, or dietary supplements you use. Also tell them if you smoke, drink alcohol, or use illegal drugs. Some items may interact with your medicine. What should I watch for while using this medicine? Visit your doctor or health care professional for regular checks on your progress. Check your blood pressure regularly. Ask your doctor or health care professional what your blood pressure should be, and when you should contact him or her. If you are a diabetic, check your blood sugar as directed. You may need to be on a special diet while taking this medicine. Check with your doctor. Also, ask how many glasses of fluid you need to drink a day. You must not get dehydrated. You may get drowsy or dizzy. Do not drive, use machinery, or do anything that needs mental alertness until you know how this drug affects you. Do not stand or sit up quickly, especially if you are an older patient. This reduces the risk of dizzy or fainting spells. Alcohol can make you more drowsy and dizzy. Avoid alcoholic drinks. What side effects may I notice from receiving this medicine? Side effects that you should report to your doctor or health care professional as soon as possible:  allergic reactions such as skin rash or itching, hives, swelling of the lips, mouth, tongue or throat  blood in urine or stool  dry mouth  hearing loss or ringing in the ears  irregular heartbeat  muscle pain, weakness or cramps  pain or difficulty passing urine  unusually weak or tired  vomiting or diarrhea Side effects that usually do not require medical attention (report to your doctor or health care professional if they continue or are bothersome):  dizzy or lightheaded  headache  increased thirst  passing large amounts of urine  sexual difficulties  stomach pain, upset or nausea This list may not describe all possible side effects. Call your doctor for medical advice about side effects. You may report side  effects to FDA at 1-800-FDA-1088. Where should I keep my medicine? Keep out of the reach of children. Store at room temperature between 15 and 30 degrees C (59 and 86 degrees F). Throw away any unused medicine after the expiration date. NOTE: This sheet is a summary. It may not cover all possible information. If you have questions about this medicine, talk to your doctor, pharmacist, or health care provider.  2020 Elsevier/Gold Standard (2008-08-11 11:35:45)

## 2019-06-18 LAB — BASIC METABOLIC PANEL
BUN/Creatinine Ratio: 10 (ref 10–24)
BUN: 14 mg/dL (ref 8–27)
CO2: 28 mmol/L (ref 20–29)
Calcium: 9.5 mg/dL (ref 8.6–10.2)
Chloride: 101 mmol/L (ref 96–106)
Creatinine, Ser: 1.38 mg/dL — ABNORMAL HIGH (ref 0.76–1.27)
GFR calc Af Amer: 59 mL/min/{1.73_m2} — ABNORMAL LOW (ref >59)
GFR calc non Af Amer: 51 mL/min/{1.73_m2} — ABNORMAL LOW (ref >59)
Glucose: 108 mg/dL — ABNORMAL HIGH (ref 65–99)
Potassium: 3.8 mmol/L (ref 3.5–5.2)
Sodium: 144 mmol/L (ref 134–144)

## 2019-06-18 LAB — PRO B NATRIURETIC PEPTIDE: NT-Pro BNP: 1324 pg/mL — ABNORMAL HIGH (ref 0–376)

## 2019-06-24 ENCOUNTER — Telehealth: Payer: Self-pay | Admitting: *Deleted

## 2019-06-24 ENCOUNTER — Other Ambulatory Visit: Payer: Self-pay | Admitting: Cardiology

## 2019-06-24 MED ORDER — TORSEMIDE 20 MG PO TABS: 20.0000 mg | ORAL_TABLET | Freq: Two times a day (BID) | ORAL | 1 refills | Status: DC

## 2019-06-24 NOTE — Telephone Encounter (Signed)
Rx refill sent to pharmacy.  *STAT* If patient is at the pharmacy, call can be transferred to refill team.   1. Which medications need to be refilled? (please list name of each medication and dose if known) Torsemide 20 mg bid  2. Which pharmacy/location (including street and city if local pharmacy) is medication to be sent to?[facility]  3. Do they need a 30 day or 90 day supply? [place]

## 2019-07-01 DIAGNOSIS — N3000 Acute cystitis without hematuria: Secondary | ICD-10-CM | POA: Diagnosis not present

## 2019-07-01 DIAGNOSIS — N401 Enlarged prostate with lower urinary tract symptoms: Secondary | ICD-10-CM | POA: Diagnosis not present

## 2019-07-01 DIAGNOSIS — E291 Testicular hypofunction: Secondary | ICD-10-CM | POA: Diagnosis not present

## 2019-07-01 DIAGNOSIS — N400 Enlarged prostate without lower urinary tract symptoms: Secondary | ICD-10-CM | POA: Diagnosis not present

## 2019-07-14 NOTE — Progress Notes (Signed)
Cardiology Office Note:    Date:  07/15/2019   ID:  Jason Stark, DOB August 06, 1946, MRN [id number]  PCP:  Renaldo Reel, PA  Cardiologist:  Shirlee More, MD    Referring MD: Renaldo Reel, PA    ASSESSMENT:    1. Hypertensive heart disease with heart failure ([place])   2. Permanent atrial fibrillation   3. Chronic anticoagulation   4. Coronary artery disease of native artery of native heart with stable angina pectoris ([place])   5. Hyperlipidemia, unspecified hyperlipidemia type   6. CKD (chronic kidney disease) stage 3, GFR 30-59 ml/min (HCC)    PLAN:    In order of problems listed above:  1. Hypertensive heart disease with heart failure- improved but still edematous I think the culprit here is gabapentin needle discontinued.  Continue his current torsemide with CKD recheck his renal function proBNP and avoid repeated office visits we will recheck liver function and lipids 2. Permanent atrial fibrillation- rate is controlled continue beta-blocker anticoagulant 3. Chronic anticoagulation- continue his anticoagulant 4. CAD- stable New York Heart Association class I 5. Hyperlipidemia- continue statin check lipids 6. CKD stage III recheck renal function potassium   Next appointment: 3 months   Medication Adjustments/Labs and Tests Ordered: Current medicines are reviewed at length with the patient today.  Concerns regarding medicines are outlined above.  No orders of the defined types were placed in this encounter.  No orders of the defined types were placed in this encounter.   Chief Complaint  Patient presents with  . Follow-up    for  . Congestive Heart Failure    History of Present Illness:    Jason Stark is a 73 y.o. male with a hx of CAD, CHF, atrial fibrillation, D LD, status post CABG, status post PCI 03/13/2016 PCI to the proximal LCx with Xience stent last seen 06/17/19.  At that time he was having trouble with lower extremity edema, difficulty taking care of  his wife at home with dementia, multiple antibiotics for UTI.  Presents today for one-month follow-up.  Last visit he had decompensated heart failure with marked fluid overload and was switched from furosemide to torsemide.  He had an echocardiogram this calendar year 01/01/2019 which showed normal left ventricular size EF 55 to 60% mild aortic and mild mitral regurgitation.  He also had moderate LVH.   Compliance with diet, lifestyle and medications: Yes  Overall he is better less edema taking torsemide although his weight has not changed no shortness of breath chest pain palpitation no bleeding from his anticoagulant he thinks he needs cataract surgery in my opinion he is optimized. Past Medical History:  Diagnosis Date  . Abnormal myocardial perfusion study 03/11/2016   Overview:  Extensive lateral ischemia , [name] 44% in distribution of SVG to [facility] and M  . Abnormal stress test 03/21/2016  . Blood glucose elevated 08/27/2017  . CAD (coronary artery disease), native coronary artery 06/13/2015   Overview:  Cardiac cath 03/13/16: Diagnostic Summary Severe native multivessel disease as described below Patent LIMA to LAD Patent SVG to PDA Occluded SVG to OM/OM2 ( jump graft) Occluded SVG to Diagonal Normal LV function Diagnostic Recommendations PCI to the Proximal LCX Medical therapy for the other disease Interventional Summary Successful PCI to the proximaL LCX with Xience 3.0 x 28 post dilated with 3.5 x 15 [place] euphora  . Cardiomegaly 08/27/2017  . Chronic anticoagulation 12/19/2015   Overview:  Edoxaban started 12/14/15  . Chronic diastolic heart failure ([place]) 06/13/2015  .  Dyslipidemia 06/13/2015  . Esophageal reflux 08/27/2017  . Gilbert syndrome 08/27/2017  . High risk medication use 06/13/2015   Overview:  Sotolol  . Hyperlipidemia 08/27/2017  . Hypertensive heart disease with heart failure ([place]) 06/13/2015  . OSA (obstructive sleep apnea) 08/27/2017  . Osteoarthritis 08/27/2017  . Persistent atrial  fibrillation 06/13/2015  . Rheumatic disease of mitral valve 08/27/2017  . Rheumatic heart failure (congestive) ([place]) 08/27/2017    Past Surgical History:  Procedure Laterality Date  . A FLUTTER ABLATION     a tach/flutter  . CHOLECYSTECTOMY    . COLONOSCOPY W/ POLYPECTOMY     non cancerous polyp. also found hemorrhoids  . CORONARY ANGIOPLASTY WITH STENT PLACEMENT    . CORONARY ARTERY BYPASS GRAFT  2011   with Maze and LAA ligation   . HERNIA REPAIR    . KNEE SURGERY    . PROSTATE SURGERY    . RECTAL SURGERY    . TOTAL KNEE ARTHROPLASTY Right 02/2019    Current Medications: Current Meds  Medication Sig  . acetaminophen (TYLENOL 8 HOUR ARTHRITIS PAIN) 650 MG CR tablet Take 1,300 mg by mouth 2 (two) times daily as needed.   [name] amLODipine-benazepril (LOTREL) 5-20 MG capsule Take 1 capsule by mouth 2 (two) times daily.   [name] aspirin EC 81 MG tablet Take 81 mg by mouth daily.  . Cholecalciferol (VITAMIN D-3) 125 MCG (5000 UT) TABS Take 1 tablet by mouth daily.  [name] edoxaban (SAVAYSA) 60 MG TABS tablet TAKE 1 TABLET DAILY (WILL PROVIDE MORE REFILLS ONCE THE PATIENT FOLLOW UP)  . ezetimibe (ZETIA) 10 MG tablet Take 10 mg by mouth daily.  . famotidine (PEPCID) 20 MG tablet Take 20 mg by mouth daily.  . Ferrous Gluconate (IRON 27 PO) Take 1 tablet by mouth daily.  . fluticasone (FLONASE) 50 MCG/ACT nasal spray 1 spray by Each Nare route daily as needed for Rhinitis.  [name] gabapentin (NEURONTIN) 300 MG capsule Take 600 mg by mouth at bedtime.  [name] KLOR-CON M10 10 MEQ tablet TAKE 1 TABLET DAILY  . loratadine (CLARITIN) 10 MG tablet Take 10 mg by mouth daily.   . metoprolol tartrate (LOPRESSOR) 25 MG tablet TAKE 1 TABLET TWICE A DAY  . pravastatin (PRAVACHOL) 40 MG tablet Take 40 mg by mouth daily.  [name] torsemide (DEMADEX) 20 MG tablet Take 1 tablet (20 mg total) by mouth 2 (two) times daily.  . vitamin E 400 UNIT capsule Take 400 Units by mouth daily.     Allergies:   Patient has no known allergies.    Social History   Socioeconomic History  . Marital status: Married    Spouse name: Not on file  . Number of children: Not on file  . Years of education: Not on file  . Highest education level: Not on file  Occupational History  . Not on file  Social Needs  . Financial resource strain: Not on file  . Food insecurity    Worry: Not on file    Inability: Not on file  . Transportation needs    Medical: Not on file    Non-medical: Not on file  Tobacco Use  . Smoking status: Former Smoker    Packs/day: 0.25    Years: 25.00    Pack years: 6.25    Types: Cigarettes    Quit date: 2010    Years since quitting: 10.6  . Smokeless tobacco: Former Systems developer    Types: [place] Ser    Quit date: 2011  Substance and Sexual Activity  . Alcohol use: No  . Drug use: No  . Sexual activity: Not on file  Lifestyle  . Physical activity    Days per week: Not on file    Minutes per session: Not on file  . Stress: Not on file  Relationships  . Social Herbalist on phone: Not on file    Gets together: Not on file    Attends religious service: Not on file    Active member of club or organization: Not on file    Attends meetings of clubs or organizations: Not on file    Relationship status: Not on file  Other Topics Concern  . Not on file  Social History Narrative  . Not on file     Family History: The patient's family history includes Diabetes in his mother; Hyperlipidemia in his brother and mother; Hypertension in his brother and father. ROS:   Please see the history of present illness.    All other systems reviewed and are negative.  EKGs/Labs/Other Studies Reviewed:    The following studies were reviewed today  Recent Labs: 06/17/2019: BUN 14; Creatinine, Ser 1.38; NT-Pro BNP 1,324; Potassium 3.8; Sodium 144  Recent Lipid Panel No results found for: CHOL, TRIG, HDL, CHOLHDL, VLDL, LDLCALC, LDLDIRECT  Physical Exam:    VS:  BP 130/72 (BP Location: Right Arm, Patient Position:  Sitting, Cuff Size: Normal)   Pulse 69   Temp 99 F (37.2 C)   Ht 5\' 11"  (1.803 m)   Wt 229 lb 3.2 oz (104 kg)   SpO2 95%   BMI 31.97 kg/m     Wt Readings from Last 3 Encounters:  07/15/19 229 lb 3.2 oz (104 kg)  06/17/19 234 lb (106.1 kg)  12/31/18 231 lb 12.8 oz (105.1 kg)     GEN:  Well nourished, well developed in no acute distress HEENT: Normal NECK: No JVD; No carotid bruits LYMPHATICS: No lymphadenopathy CARDIAC: Irregular variable first heart sound 1 of 6 MR  RESPIRATORY:  Clear to auscultation without rales, wheezing or rhonchi  ABDOMEN: Soft, non-tender, non-distended MUSCULOSKELETAL: 1+ bilateral pitting ankle to knee edema; No deformity  SKIN: Warm and dry NEUROLOGIC:  Alert and oriented x 3 PSYCHIATRIC:  Normal affect    Signed, Shirlee More, MD  07/15/2019 11:40 AM    [place]

## 2019-07-15 ENCOUNTER — Encounter: Payer: Self-pay | Admitting: Cardiology

## 2019-07-15 ENCOUNTER — Ambulatory Visit (INDEPENDENT_AMBULATORY_CARE_PROVIDER_SITE_OTHER): Payer: Medicare Other | Admitting: Cardiology

## 2019-07-15 ENCOUNTER — Other Ambulatory Visit: Payer: Self-pay

## 2019-07-15 VITALS — BP 130/72 | HR 69 | Temp 99.0°F | Ht 71.0 in | Wt 229.2 lb

## 2019-07-15 DIAGNOSIS — E785 Hyperlipidemia, unspecified: Secondary | ICD-10-CM | POA: Diagnosis not present

## 2019-07-15 DIAGNOSIS — N183 Chronic kidney disease, stage 3 unspecified: Secondary | ICD-10-CM

## 2019-07-15 DIAGNOSIS — I25118 Atherosclerotic heart disease of native coronary artery with other forms of angina pectoris: Secondary | ICD-10-CM

## 2019-07-15 DIAGNOSIS — I110 Hypertensive heart disease with heart failure: Secondary | ICD-10-CM | POA: Diagnosis not present

## 2019-07-15 DIAGNOSIS — I4821 Permanent atrial fibrillation: Secondary | ICD-10-CM

## 2019-07-15 DIAGNOSIS — Z7901 Long term (current) use of anticoagulants: Secondary | ICD-10-CM | POA: Diagnosis not present

## 2019-07-15 HISTORY — DX: Chronic kidney disease, stage 3 unspecified: N18.30

## 2019-07-15 NOTE — Patient Instructions (Addendum)
Medication Instructions:  Your physician has recommended you make the following change in your medication:   STOP: Gabapentin   If you need a refill on your cardiac medications before your next appointment, please call your pharmacy.   Lab work: Your physician recommends that you return for lab work in: TODAY CMP,LIPID,Pro BNP  If you have labs (blood work) drawn today and your tests are completely normal, you will receive your results only by: [name] MyChart Message (if you have MyChart) OR . A paper copy in the mail If you have any lab test that is abnormal or we need to change your treatment, we will call you to review the results.  Testing/Procedures: nOne  Follow-Up: At [facility], you and your health needs are our priority.  As part of our continuing mission to provide you with exceptional heart care, we have created designated Provider Care Teams.  These Care Teams include your primary Cardiologist (physician) and Advanced Practice Providers (APPs -  Physician Assistants and Nurse Practitioners) who all work together to provide you with the care you need, when you need it. You will need a follow up appointment in 6  months with Jason Stark Any Other Special Instructions Will Be Listed Below (If Applicable).

## 2019-07-16 LAB — LIPID PANEL
Chol/HDL Ratio: 4.1 ratio (ref 0.0–5.0)
Cholesterol, Total: 107 mg/dL (ref 100–199)
HDL: 26 mg/dL — ABNORMAL LOW (ref >39)
LDL Calculated: 52 mg/dL (ref 0–99)
Triglycerides: 146 mg/dL (ref 0–149)
VLDL Cholesterol Cal: 29 mg/dL (ref 5–40)

## 2019-07-16 LAB — COMPREHENSIVE METABOLIC PANEL
ALT: 14 IU/L (ref 0–44)
AST: 15 IU/L (ref 0–40)
Albumin/Globulin Ratio: 2.1 (ref 1.2–2.2)
Albumin: 4.5 g/dL (ref 3.7–4.7)
Alkaline Phosphatase: 62 IU/L (ref 39–117)
BUN/Creatinine Ratio: 11 (ref 10–24)
BUN: 16 mg/dL (ref 8–27)
Bilirubin Total: 2 mg/dL — ABNORMAL HIGH (ref 0.0–1.2)
CO2: 28 mmol/L (ref 20–29)
Calcium: 9.5 mg/dL (ref 8.6–10.2)
Chloride: 100 mmol/L (ref 96–106)
Creatinine, Ser: 1.47 mg/dL — ABNORMAL HIGH (ref 0.76–1.27)
GFR calc Af Amer: 55 mL/min/{1.73_m2} — ABNORMAL LOW (ref >59)
GFR calc non Af Amer: 47 mL/min/{1.73_m2} — ABNORMAL LOW (ref >59)
Globulin, Total: 2.1 g/dL (ref 1.5–4.5)
Glucose: 103 mg/dL — ABNORMAL HIGH (ref 65–99)
Potassium: 4.2 mmol/L (ref 3.5–5.2)
Sodium: 145 mmol/L — ABNORMAL HIGH (ref 134–144)
Total Protein: 6.6 g/dL (ref 6.0–8.5)

## 2019-07-16 LAB — PRO B NATRIURETIC PEPTIDE: NT-Pro BNP: 1005 pg/mL — ABNORMAL HIGH (ref 0–376)

## 2019-07-16 IMAGING — NM NM MISC PROCEDURE
2 series · 12 of 12 positions shown · non-contrast
Comparison: none

[Series 1: rest_(id)_sa · 6.4mm · 6.40mm/px · 6 of 64 frames shown]
[frame 6/64]
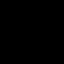
[frame 16/64]
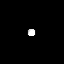
[frame 27/64]
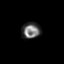
[frame 38/64]
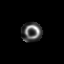
[frame 48/64]
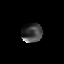
[frame 59/64]
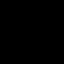

[Series 1: stress_(id)_sa · 6.4mm · 6.40mm/px · 6 of 64 frames shown]
[frame 6/64]
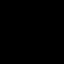
[frame 16/64]
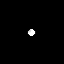
[frame 27/64]
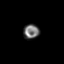
[frame 38/64]
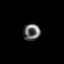
[frame 48/64]
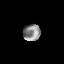
[frame 59/64]
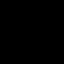

[12 of 12 positions shown; findings below may reference images not displayed]

Canned report from images found in remote index.

Refer to host system for actual result text.

## 2019-07-22 DIAGNOSIS — H25043 Posterior subcapsular polar age-related cataract, bilateral: Secondary | ICD-10-CM | POA: Diagnosis not present

## 2019-07-22 DIAGNOSIS — H2512 Age-related nuclear cataract, left eye: Secondary | ICD-10-CM | POA: Diagnosis not present

## 2019-07-22 DIAGNOSIS — H2513 Age-related nuclear cataract, bilateral: Secondary | ICD-10-CM | POA: Diagnosis not present

## 2019-07-22 DIAGNOSIS — H25013 Cortical age-related cataract, bilateral: Secondary | ICD-10-CM | POA: Diagnosis not present

## 2019-07-22 DIAGNOSIS — H18413 Arcus senilis, bilateral: Secondary | ICD-10-CM | POA: Diagnosis not present

## 2019-07-29 ENCOUNTER — Other Ambulatory Visit: Payer: Self-pay | Admitting: Cardiology

## 2019-08-11 DIAGNOSIS — N182 Chronic kidney disease, stage 2 (mild): Secondary | ICD-10-CM | POA: Diagnosis not present

## 2019-08-11 DIAGNOSIS — Z139 Encounter for screening, unspecified: Secondary | ICD-10-CM | POA: Diagnosis not present

## 2019-08-11 DIAGNOSIS — E785 Hyperlipidemia, unspecified: Secondary | ICD-10-CM | POA: Diagnosis not present

## 2019-08-11 DIAGNOSIS — D649 Anemia, unspecified: Secondary | ICD-10-CM | POA: Diagnosis not present

## 2019-08-11 DIAGNOSIS — R7303 Prediabetes: Secondary | ICD-10-CM | POA: Diagnosis not present

## 2019-08-11 DIAGNOSIS — I130 Hypertensive heart and chronic kidney disease with heart failure and stage 1 through stage 4 chronic kidney disease, or unspecified chronic kidney disease: Secondary | ICD-10-CM | POA: Diagnosis not present

## 2019-08-18 DIAGNOSIS — H2511 Age-related nuclear cataract, right eye: Secondary | ICD-10-CM | POA: Diagnosis not present

## 2019-08-18 DIAGNOSIS — H2512 Age-related nuclear cataract, left eye: Secondary | ICD-10-CM | POA: Diagnosis not present

## 2019-08-19 DIAGNOSIS — H2511 Age-related nuclear cataract, right eye: Secondary | ICD-10-CM | POA: Diagnosis not present

## 2019-08-24 DIAGNOSIS — J301 Allergic rhinitis due to pollen: Secondary | ICD-10-CM | POA: Diagnosis not present

## 2019-08-24 DIAGNOSIS — G4733 Obstructive sleep apnea (adult) (pediatric): Secondary | ICD-10-CM | POA: Diagnosis not present

## 2019-08-24 DIAGNOSIS — J4520 Mild intermittent asthma, uncomplicated: Secondary | ICD-10-CM | POA: Diagnosis not present

## 2019-09-01 DIAGNOSIS — H2512 Age-related nuclear cataract, left eye: Secondary | ICD-10-CM | POA: Diagnosis not present

## 2019-09-01 DIAGNOSIS — H2511 Age-related nuclear cataract, right eye: Secondary | ICD-10-CM | POA: Diagnosis not present

## 2019-10-05 DIAGNOSIS — R05 Cough: Secondary | ICD-10-CM | POA: Diagnosis not present

## 2019-10-05 DIAGNOSIS — Z20828 Contact with and (suspected) exposure to other viral communicable diseases: Secondary | ICD-10-CM | POA: Diagnosis not present

## 2019-12-11 ENCOUNTER — Other Ambulatory Visit: Payer: Self-pay | Admitting: Cardiology

## 2019-12-24 ENCOUNTER — Other Ambulatory Visit: Payer: Self-pay | Admitting: Cardiology

## 2020-01-03 DIAGNOSIS — N318 Other neuromuscular dysfunction of bladder: Secondary | ICD-10-CM | POA: Diagnosis not present

## 2020-01-03 DIAGNOSIS — N401 Enlarged prostate with lower urinary tract symptoms: Secondary | ICD-10-CM | POA: Diagnosis not present

## 2020-01-03 DIAGNOSIS — N3020 Other chronic cystitis without hematuria: Secondary | ICD-10-CM | POA: Diagnosis not present

## 2020-01-03 DIAGNOSIS — E291 Testicular hypofunction: Secondary | ICD-10-CM | POA: Diagnosis not present

## 2020-01-10 DIAGNOSIS — H43392 Other vitreous opacities, left eye: Secondary | ICD-10-CM | POA: Diagnosis not present

## 2020-01-10 DIAGNOSIS — Z961 Presence of intraocular lens: Secondary | ICD-10-CM | POA: Diagnosis not present

## 2020-01-11 NOTE — Progress Notes (Signed)
Cardiology Office Note:    Date:  01/12/2020   ID:  Jason Stark, DOB 08/17/1946, MRN [id number]  PCP:  Jason Reel, PA  Cardiologist:  Jason More, MD    Referring MD: Jason Reel, PA    ASSESSMENT:    1. Hypertensive heart disease with heart failure (Jason Stark)   2. Permanent atrial fibrillation (Jason Stark)   3. Chronic anticoagulation   4. Coronary artery disease of native artery of native heart with stable angina pectoris (Jason Stark)   5. Stage 3 chronic kidney disease, unspecified whether stage 3a or 3b CKD   6. Hyperlipidemia, unspecified hyperlipidemia type    PLAN:    In order of problems listed above:  1. His heart failure is well compensated we will decrease his diuretic with CKD and he will get back to weighing daily contact us if his weight increases and we will increase his diuretic at that point.  Recent labs reviewed from his PCP with stable CKD. 2. Atrial fibrillation rate is controlled continue beta-blocker and anticoagulant 3. CAD is stable New York Heart Association class I having no anginal discomfort and continue current medical treatment including beta-blocker calcium channel blocker and lipid-lowering with statin and Zetia.   Next appointment: Months   Medication Adjustments/Labs and Tests Ordered: Current medicines are reviewed at length with the patient today.  Concerns regarding medicines are outlined above.  No orders of the defined types were placed in this encounter.  No orders of the defined types were placed in this encounter.   Chief Complaint  Patient presents with  . Follow-up  . Congestive Heart Failure    History of Present Illness:    Jason Stark is a 74 y.o. male with a hx of CAD, CHF, atrial fibrillation, D LD, status post CABG, status post PCI 03/13/2016 PCI to the proximal LCx with Xience stent  last seen 07/15/2019.He had an echocardiogram 01/01/2019 which showed normal left ventricular size EF 55 to 60% mild aortic and mild  mitral regurgitation. He also had moderate LVH.   Compliance with diet, lifestyle and medications: Yes he is no longer weighing daily.  Biggest stressor in his life is taking care of his wife who has dementia.  He does take care of himself and has had no edema shortness of breath palpitations syncope no bleeding complication of his anticoagulant and no chest pain.  He is concerned with renal dysfunction recent creatinine 1.45 ask if he can take a lower dose of diuretic I told him we can reduce it provided he weighs himself daily and at this point in time he just does not need an exacerbation of heart failure and admission to the hospital. Past Medical History:  Diagnosis Date  . Abnormal myocardial perfusion study 03/11/2016   Overview:  Extensive lateral ischemia , [name] 44% in distribution of Jason to [facility] and M  . Abnormal stress test 03/21/2016  . Blood glucose elevated 08/27/2017  . CAD (coronary artery disease), native coronary artery 06/13/2015   Overview:  Cardiac cath 03/13/16: Diagnostic Summary Severe native multivessel disease as described below Patent LIMA to LAD Patent Jason to PDA Occluded Jason to OM/OM2 ( jump graft) Occluded Jason to Diagonal Normal LV function Diagnostic Recommendations PCI to the Proximal LCX Medical therapy for the other disease Interventional Summary Successful PCI to the proximaL LCX with Xience 3.0 x 28 post dilated with 3.5 x 15 Jason Stark euphora  . Cardiomegaly 08/27/2017  . Chronic anticoagulation 12/19/2015   Overview:  Edoxaban started 12/14/15  .  Chronic diastolic heart failure (Jason Stark) 06/13/2015  . Dyslipidemia 06/13/2015  . Esophageal reflux 08/27/2017  . Gilbert syndrome 08/27/2017  . High risk medication use 06/13/2015   Overview:  Sotolol  . Hyperlipidemia 08/27/2017  . Hypertensive heart disease with heart failure (Jason Stark) 06/13/2015  . OSA (obstructive sleep apnea) 08/27/2017  . Osteoarthritis 08/27/2017  . Persistent atrial fibrillation (Jason Stark) 06/13/2015  . Rheumatic disease of  mitral valve 08/27/2017  . Rheumatic heart failure (congestive) (Jason Stark) 08/27/2017    Past Surgical History:  Procedure Laterality Date  . A FLUTTER ABLATION     a tach/flutter  . CHOLECYSTECTOMY    . COLONOSCOPY W/ POLYPECTOMY     non cancerous polyp. also found hemorrhoids  . CORONARY ANGIOPLASTY WITH STENT PLACEMENT    . CORONARY ARTERY BYPASS GRAFT  2011   with Maze and LAA ligation   . HERNIA REPAIR    . KNEE SURGERY    . PROSTATE SURGERY    . RECTAL SURGERY    . TOTAL KNEE ARTHROPLASTY Right 02/2019    Current Medications: Current Meds  Medication Sig  . acetaminophen (TYLENOL 8 HOUR ARTHRITIS PAIN) 650 MG CR tablet Take 1,300 mg by mouth 2 (two) times daily as needed.   [name] amLODipine-benazepril (LOTREL) 5-20 MG capsule Take 1 capsule by mouth 2 (two) times daily.   [name] aspirin EC 81 MG tablet Take 81 mg by mouth daily.  . Cholecalciferol (VITAMIN D-3) 125 MCG (5000 UT) TABS Take 1 tablet by mouth daily.  [name] ezetimibe (ZETIA) 10 MG tablet TAKE 1 TABLET DAILY  . famotidine (PEPCID) 20 MG tablet Take 20 mg by mouth daily.  . Ferrous Gluconate (IRON 27 PO) Take 1 tablet by mouth daily.  . fluticasone (FLONASE) 50 MCG/ACT nasal spray 1 spray by Each Nare route daily as needed for Rhinitis.  [name] loratadine (CLARITIN) 10 MG tablet Take 10 mg by mouth daily.   . metoprolol tartrate (LOPRESSOR) 25 MG tablet TAKE 1 TABLET TWICE A DAY  . potassium chloride (KLOR-CON) 10 MEQ tablet TAKE 1 TABLET DAILY  . pravastatin (PRAVACHOL) 40 MG tablet Take 40 mg by mouth daily.  [name] SAVAYSA 60 MG TABS tablet TAKE 1 TABLET DAILY (WILL PROVIDE Stark REFILLS ONCE THE PATIENT FOLLOW UP)  . torsemide (DEMADEX) 20 MG tablet TAKE 1 TABLET TWICE A DAY  . vitamin E 400 UNIT capsule Take 400 Units by mouth daily.     Allergies:   Patient has no known allergies.   Social History   Socioeconomic History  . Marital status: Married    Spouse name: Not on file  . Number of children: Not on file  . Years of  education: Not on file  . Highest education level: Not on file  Occupational History  . Not on file  Tobacco Use  . Smoking status: Former Smoker    Packs/day: 0.25    Years: 25.00    Pack years: 6.25    Types: Cigarettes    Quit date: 2010    Years since quitting: 11.0  . Smokeless tobacco: Former Systems developer    Types: [place] date: 2011  Substance and Sexual Activity  . Alcohol use: No  . Drug use: No  . Sexual activity: Not on file  Other Topics Concern  . Not on file  Social History Narrative  . Not on file   Social Determinants of Health   Financial Resource Strain:   . Difficulty of Paying Living Expenses: Not on  file  Food Insecurity:   . Worried About Charity fundraiser in the Last Year: Not on file  . Ran Out of Food in the Last Year: Not on file  Transportation Needs:   . Lack of Transportation (Medical): Not on file  . Lack of Transportation (Non-Medical): Not on file  Physical Activity:   . Days of Exercise per Week: Not on file  . Minutes of Exercise per Session: Not on file  Stress:   . Feeling of Stress : Not on file  Social Connections:   . Frequency of Communication with Friends and Family: Not on file  . Frequency of Social Gatherings with Friends and Family: Not on file  . Attends Religious Services: Not on file  . Active Member of Clubs or Organizations: Not on file  . Attends Archivist Meetings: Not on file  . Marital Status: Not on file     Family History: The patient's family history includes Diabetes in his mother; Hyperlipidemia in his brother and mother; Hypertension in his brother and father. ROS:   Please see the history of present illness.    All other systems reviewed and are negative.  EKGs/Labs/Other Studies Reviewed:    The following studies were reviewed today:  EKG:  EKG ordered today and personally reviewed.  The ekg ordered today demonstrates atrial fibrillation controlled ventricular rate right bundle branch  block  Recent Labs: 07/15/2019: ALT 14; BUN 16; Creatinine, Ser 1.47; NT-Pro BNP 1,005; Potassium 4.2; Sodium 145  Recent Lipid Panel    Component Value Date/Time   CHOL 107 07/15/2019 1149   TRIG 146 07/15/2019 1149   HDL 26 (L) 07/15/2019 1149   CHOLHDL 4.1 07/15/2019 1149   LDLCALC 52 07/15/2019 1149    Physical Exam:    VS:  BP 126/82   Pulse 69   Ht 5\' 11"  (1.803 m)   Wt 229 lb 12.8 oz (104.2 kg)   SpO2 94%   BMI 32.05 kg/m     Wt Readings from Last 3 Encounters:  01/12/20 229 lb 12.8 oz (104.2 kg)  07/15/19 229 lb 3.2 oz (104 kg)  06/17/19 234 lb (106.1 kg)     GEN:  Well nourished, well developed in no acute distress HEENT: Normal NECK: No JVD; No carotid bruits LYMPHATICS: No lymphadenopathy CARDIAC: Heart rhythm is irregular S1 is variable 1 of 6 MR , no  rubs, gallops RESPIRATORY:  Clear to auscultation without rales, wheezing or rhonchi  ABDOMEN: Soft, non-tender, non-distended MUSCULOSKELETAL:  No edema; No deformity  SKIN: Warm and dry NEUROLOGIC:  Alert and oriented x 3 PSYCHIATRIC:  Normal affect    Signed, Jason More, MD  01/12/2020 10:12 AM    [place]

## 2020-01-12 ENCOUNTER — Encounter: Payer: Self-pay | Admitting: Cardiology

## 2020-01-12 ENCOUNTER — Ambulatory Visit (INDEPENDENT_AMBULATORY_CARE_PROVIDER_SITE_OTHER): Payer: Medicare Other | Admitting: Cardiology

## 2020-01-12 ENCOUNTER — Other Ambulatory Visit: Payer: Self-pay

## 2020-01-12 VITALS — BP 126/82 | HR 69 | Ht 71.0 in | Wt 229.8 lb

## 2020-01-12 DIAGNOSIS — E785 Hyperlipidemia, unspecified: Secondary | ICD-10-CM

## 2020-01-12 DIAGNOSIS — N183 Chronic kidney disease, stage 3 unspecified: Secondary | ICD-10-CM | POA: Diagnosis not present

## 2020-01-12 DIAGNOSIS — I4821 Permanent atrial fibrillation: Secondary | ICD-10-CM

## 2020-01-12 DIAGNOSIS — I25118 Atherosclerotic heart disease of native coronary artery with other forms of angina pectoris: Secondary | ICD-10-CM

## 2020-01-12 DIAGNOSIS — I110 Hypertensive heart disease with heart failure: Secondary | ICD-10-CM | POA: Diagnosis not present

## 2020-01-12 DIAGNOSIS — Z7901 Long term (current) use of anticoagulants: Secondary | ICD-10-CM | POA: Diagnosis not present

## 2020-01-12 MED ORDER — EDOXABAN TOSYLATE 60 MG PO TABS: ORAL_TABLET | ORAL | 0 refills | Status: DC

## 2020-01-12 MED ORDER — TORSEMIDE 20 MG PO TABS: 20.0000 mg | ORAL_TABLET | Freq: Once | ORAL | 0 refills | Status: DC

## 2020-01-12 MED ORDER — METOPROLOL TARTRATE 25 MG PO TABS: 25.0000 mg | ORAL_TABLET | Freq: Two times a day (BID) | ORAL | 2 refills | Status: DC

## 2020-01-12 NOTE — Patient Instructions (Addendum)
Medication Instructions:  Your physician has recommended you make the following change in your medication:  DECREASE torsemide to 20 mg (1 tablet) M/W/F onlye call office if weigh increasese 5 lbs    *If you need a refill on your cardiac medications before your next appointment, please call your pharmacy*  Lab Work: NONE If you have labs (blood work) drawn today and your tests are completely normal, you will receive your results only by: [name] MyChart Message (if you have MyChart) OR . A paper copy in the mail If you have any lab test that is abnormal or we need to change your treatment, we will call you to review the results.  Testing/Procedures: You had an EKG performed  Follow-Up: At [facility], you and your health needs are our priority.  As part of our continuing mission to provide you with exceptional heart care, we have created designated Provider Care Teams.  These Care Teams include your primary Cardiologist (physician) and Advanced Practice Providers (APPs -  Physician Assistants and Nurse Practitioners) who all work together to provide you with the care you need, when you need it.  Your next appointment:   6 month(s)  The format for your next appointment:   In Person  Provider:   Shirlee More, MD

## 2020-01-14 ENCOUNTER — Other Ambulatory Visit: Payer: Self-pay

## 2020-01-14 ENCOUNTER — Telehealth: Payer: Self-pay | Admitting: Cardiology

## 2020-01-14 MED ORDER — EDOXABAN TOSYLATE 60 MG PO TABS: ORAL_TABLET | ORAL | 3 refills | Status: DC

## 2020-01-14 MED ORDER — TORSEMIDE 20 MG PO TABS: 20.0000 mg | ORAL_TABLET | Freq: Once | ORAL | 3 refills | Status: DC

## 2020-01-14 MED ORDER — PRAVASTATIN SODIUM 40 MG PO TABS: 40.0000 mg | ORAL_TABLET | Freq: Every day | ORAL | 3 refills | Status: AC

## 2020-01-14 NOTE — Telephone Encounter (Signed)
[facility]

## 2020-01-14 NOTE — Progress Notes (Signed)
[facility]

## 2020-01-14 NOTE — Telephone Encounter (Signed)
Patient needs his meds  Sayvasa and [facility]ravastatin [facility] and Torsem[facility] also, we called to the Wrong pharmacy, he is out and please call asap!

## 2020-01-17 ENCOUNTER — Other Ambulatory Visit: Payer: Self-pay | Admitting: Cardiology

## 2020-01-24 DIAGNOSIS — H43392 Other vitreous opacities, left eye: Secondary | ICD-10-CM | POA: Diagnosis not present

## 2020-01-24 DIAGNOSIS — Z961 Presence of intraocular lens: Secondary | ICD-10-CM | POA: Diagnosis not present

## 2020-02-11 DIAGNOSIS — Z8249 Family history of ischemic heart disease and other diseases of the circulatory system: Secondary | ICD-10-CM | POA: Diagnosis not present

## 2020-02-11 DIAGNOSIS — Z148 Genetic carrier of other disease: Secondary | ICD-10-CM | POA: Diagnosis not present

## 2020-02-11 DIAGNOSIS — I420 Dilated cardiomyopathy: Secondary | ICD-10-CM | POA: Diagnosis not present

## 2020-02-11 DIAGNOSIS — I499 Cardiac arrhythmia, unspecified: Secondary | ICD-10-CM | POA: Diagnosis not present

## 2020-02-11 DIAGNOSIS — Z1589 Genetic susceptibility to other disease: Secondary | ICD-10-CM | POA: Diagnosis not present

## 2020-02-11 DIAGNOSIS — Z7901 Long term (current) use of anticoagulants: Secondary | ICD-10-CM | POA: Diagnosis not present

## 2020-02-11 DIAGNOSIS — I10 Essential (primary) hypertension: Secondary | ICD-10-CM | POA: Diagnosis not present

## 2020-02-11 DIAGNOSIS — Z8481 Family history of carrier of genetic disease: Secondary | ICD-10-CM | POA: Diagnosis not present

## 2020-02-11 DIAGNOSIS — E7800 Pure hypercholesterolemia, unspecified: Secondary | ICD-10-CM | POA: Diagnosis not present

## 2020-02-18 DIAGNOSIS — Z23 Encounter for immunization: Secondary | ICD-10-CM | POA: Diagnosis not present

## 2020-02-21 DIAGNOSIS — Z961 Presence of intraocular lens: Secondary | ICD-10-CM | POA: Diagnosis not present

## 2020-02-21 DIAGNOSIS — H43392 Other vitreous opacities, left eye: Secondary | ICD-10-CM | POA: Diagnosis not present

## 2020-02-23 DIAGNOSIS — J4520 Mild intermittent asthma, uncomplicated: Secondary | ICD-10-CM | POA: Diagnosis not present

## 2020-02-23 DIAGNOSIS — J301 Allergic rhinitis due to pollen: Secondary | ICD-10-CM | POA: Diagnosis not present

## 2020-02-23 DIAGNOSIS — G4733 Obstructive sleep apnea (adult) (pediatric): Secondary | ICD-10-CM | POA: Diagnosis not present

## 2020-03-02 DIAGNOSIS — M25461 Effusion, right knee: Secondary | ICD-10-CM | POA: Diagnosis not present

## 2020-03-02 DIAGNOSIS — Z96651 Presence of right artificial knee joint: Secondary | ICD-10-CM | POA: Diagnosis not present

## 2020-03-02 DIAGNOSIS — Z471 Aftercare following joint replacement surgery: Secondary | ICD-10-CM | POA: Diagnosis not present

## 2020-03-02 DIAGNOSIS — I708 Atherosclerosis of other arteries: Secondary | ICD-10-CM | POA: Diagnosis not present

## 2020-03-02 DIAGNOSIS — Z87891 Personal history of nicotine dependence: Secondary | ICD-10-CM | POA: Diagnosis not present

## 2020-03-17 DIAGNOSIS — Z23 Encounter for immunization: Secondary | ICD-10-CM | POA: Diagnosis not present

## 2020-04-12 DIAGNOSIS — N182 Chronic kidney disease, stage 2 (mild): Secondary | ICD-10-CM | POA: Diagnosis not present

## 2020-04-12 DIAGNOSIS — D649 Anemia, unspecified: Secondary | ICD-10-CM | POA: Diagnosis not present

## 2020-04-12 DIAGNOSIS — I130 Hypertensive heart and chronic kidney disease with heart failure and stage 1 through stage 4 chronic kidney disease, or unspecified chronic kidney disease: Secondary | ICD-10-CM | POA: Diagnosis not present

## 2020-04-12 DIAGNOSIS — E785 Hyperlipidemia, unspecified: Secondary | ICD-10-CM | POA: Diagnosis not present

## 2020-04-12 DIAGNOSIS — R7303 Prediabetes: Secondary | ICD-10-CM | POA: Diagnosis not present

## 2020-04-18 DIAGNOSIS — Z125 Encounter for screening for malignant neoplasm of prostate: Secondary | ICD-10-CM | POA: Diagnosis not present

## 2020-04-18 DIAGNOSIS — Z9181 History of falling: Secondary | ICD-10-CM | POA: Diagnosis not present

## 2020-04-18 DIAGNOSIS — Z1331 Encounter for screening for depression: Secondary | ICD-10-CM | POA: Diagnosis not present

## 2020-04-18 DIAGNOSIS — Z6832 Body mass index (BMI) 32.0-32.9, adult: Secondary | ICD-10-CM | POA: Diagnosis not present

## 2020-04-18 DIAGNOSIS — Z0000 Encounter for general adult medical examination without abnormal findings: Secondary | ICD-10-CM | POA: Diagnosis not present

## 2020-04-18 DIAGNOSIS — E785 Hyperlipidemia, unspecified: Secondary | ICD-10-CM | POA: Diagnosis not present

## 2020-06-27 DIAGNOSIS — R1084 Generalized abdominal pain: Secondary | ICD-10-CM | POA: Diagnosis not present

## 2020-06-27 DIAGNOSIS — K644 Residual hemorrhoidal skin tags: Secondary | ICD-10-CM | POA: Diagnosis not present

## 2020-06-27 DIAGNOSIS — K625 Hemorrhage of anus and rectum: Secondary | ICD-10-CM | POA: Diagnosis not present

## 2020-06-28 DIAGNOSIS — K5730 Diverticulosis of large intestine without perforation or abscess without bleeding: Secondary | ICD-10-CM | POA: Diagnosis not present

## 2020-06-28 DIAGNOSIS — R1084 Generalized abdominal pain: Secondary | ICD-10-CM | POA: Diagnosis not present

## 2020-07-03 DIAGNOSIS — K644 Residual hemorrhoidal skin tags: Secondary | ICD-10-CM | POA: Diagnosis not present

## 2020-07-03 DIAGNOSIS — I700 Atherosclerosis of aorta: Secondary | ICD-10-CM | POA: Diagnosis not present

## 2020-07-03 DIAGNOSIS — S161XXA Strain of muscle, fascia and tendon at neck level, initial encounter: Secondary | ICD-10-CM | POA: Diagnosis not present

## 2020-07-03 DIAGNOSIS — K5900 Constipation, unspecified: Secondary | ICD-10-CM | POA: Diagnosis not present

## 2020-07-17 DIAGNOSIS — N401 Enlarged prostate with lower urinary tract symptoms: Secondary | ICD-10-CM | POA: Diagnosis not present

## 2020-07-17 DIAGNOSIS — N200 Calculus of kidney: Secondary | ICD-10-CM | POA: Diagnosis not present

## 2020-08-03 NOTE — Progress Notes (Signed)
Cardiology Office Note:    Date:  08/04/2020   ID:  Jason Stark, DOB [birth year], MRN [id number]  PCP:  Renaldo Reel, PA  Cardiologist:  Shirlee More, MD    Referring MD: Renaldo Reel, PA he is seeing you next week I told him to stop taking aspirin is going to break his anticoagulant till Monday and need labs in your office I would do a CBC CMP proBNP and lipid profile.  Please send a copy to me   ASSESSMENT:    1. Permanent atrial fibrillation ([place])   2. Chronic anticoagulation   3. Hypertensive heart disease with heart failure (HCC)   4. Stage 3 chronic kidney disease, unspecified whether stage 3a or 3b CKD   5. Coronary artery disease of native artery of native heart with stable angina pectoris ([place])   6. Hyperlipidemia, unspecified hyperlipidemia type    PLAN:    In order of problems listed above:  1. Stable rate is controlled beta-blocker long-term continue anticoagulant he will withdrawal until Monday until he stops having rectal bleeding.  He needs to be seen by GI and would benefit from colonoscopy. 2. BP at target heart failure compensated continue current treatment including his loop diuretic beta-blocker antihypertensive calcium channel blocker ACE inhibitor and he needs labs next Friday with his PCP including renal function proBNP level 3. Stable on recent labs recheck labs next week 4. Stable having no angina on medical therapy I think we should discontinue the aspirin while he is anticoagulated continue his beta-blocker and lipid-lowering therapy pravastatin will need labs next week   Next appointment: 6 months   Medication Adjustments/Labs and Tests Ordered: Current medicines are reviewed at length with the patient today.  Concerns regarding medicines are outlined above.  No orders of the defined types were placed in this encounter.  No orders of the defined types were placed in this encounter.   Chief Complaint  Patient presents with  . Follow-up  .  Atrial Fibrillation  . Anticoagulation  . Coronary Artery Disease  . Congestive Heart Failure    History of Present Illness:    Jason Stark is a 74 y.o. male with a hx of CAD, CHF, atrial fibrillation, D LD, status post CABG, status post PCI 03/13/2016 PCI to the proximal LCx with Xience stent.He had an echocardiogram 01/01/2019 which showed normal left ventricular size EF 55 to 60% mild aortic and mild mitral regurgitation. He also had moderate LVH.     last seen 01/12/2020. Compliance with diet, lifestyle and medications: Yes   Recently has been having rectal bleeding he has been seen by his PCP added abdominal CT scan done and discussed referring him to GI he still having mild degree of rectal bleeding I told him to withdraw his anticoagulant till Monday from his description they think it is hemorrhoidal.  I was going draw labs today but he is seeing his PCP next week he will need a CBC CMP and proBNP level. Past Medical History:  Diagnosis Date  . Abnormal myocardial perfusion study 03/11/2016   Overview:  Extensive lateral ischemia , [name] 44% in distribution of SVG to [facility] and M  . Abnormal stress test 03/21/2016  . Blood glucose elevated 08/27/2017  . CAD (coronary artery disease), native coronary artery 06/13/2015   Overview:  Cardiac cath 03/13/16: Diagnostic Summary Severe native multivessel disease as described below Patent LIMA to LAD Patent SVG to PDA Occluded SVG to OM/OM2 ( jump graft) Occluded SVG to Diagonal Normal  LV function Diagnostic Recommendations PCI to the Proximal LCX Medical therapy for the other disease Interventional Summary Successful PCI to the proximaL LCX with Xience 3.0 x 28 post dilated with 3.5 x 15 [place] euphora  . Cardiomegaly 08/27/2017  . Chronic anticoagulation 12/19/2015   Overview:  Edoxaban started 12/14/15  . Chronic diastolic heart failure ([place]) 06/13/2015  . Dyslipidemia 06/13/2015  . Esophageal reflux 08/27/2017  . Gilbert syndrome 08/27/2017  . High risk  medication use 06/13/2015   Overview:  Sotolol  . Hyperlipidemia 08/27/2017  . Hypertensive heart disease with heart failure ([place]) 06/13/2015  . OSA (obstructive sleep apnea) 08/27/2017  . Osteoarthritis 08/27/2017  . Persistent atrial fibrillation ([place]) 06/13/2015  . Rheumatic disease of mitral valve 08/27/2017  . Rheumatic heart failure (congestive) ([place]) 08/27/2017    Past Surgical History:  Procedure Laterality Date  . A FLUTTER ABLATION     a tach/flutter  . CHOLECYSTECTOMY    . COLONOSCOPY W/ POLYPECTOMY     non cancerous polyp. also found hemorrhoids  . CORONARY ANGIOPLASTY WITH STENT PLACEMENT    . CORONARY ARTERY BYPASS GRAFT  2011   with Maze and LAA ligation   . HERNIA REPAIR    . KNEE SURGERY    . PROSTATE SURGERY    . RECTAL SURGERY    . TOTAL KNEE ARTHROPLASTY Right 02/2019    Current Medications: Current Meds  Medication Sig  . acetaminophen (TYLENOL 8 HOUR ARTHRITIS PAIN) 650 MG CR tablet Take 1,300 mg by mouth 2 (two) times daily as needed.   [name] amLODipine-benazepril (LOTREL) 5-20 MG capsule Take 1 capsule by mouth 2 (two) times daily.   [name] aspirin EC 81 MG tablet Take 81 mg by mouth daily.  . Cholecalciferol (VITAMIN D-3) 125 MCG (5000 UT) TABS Take 1 tablet by mouth daily.  [name] edoxaban (SAVAYSA) 60 MG TABS tablet TAKE 1 TABLET DAILY  . ezetimibe (ZETIA) 10 MG tablet TAKE 1 TABLET DAILY  . famotidine (PEPCID) 20 MG tablet Take 20 mg by mouth daily.  . Ferrous Gluconate (IRON 27 PO) Take 1 tablet by mouth daily.  . fluticasone (FLONASE) 50 MCG/ACT nasal spray 1 spray by Each Nare route daily as needed for Rhinitis.  [name] loratadine (CLARITIN) 10 MG tablet Take 10 mg by mouth daily.   . metoprolol tartrate (LOPRESSOR) 25 MG tablet TAKE 1 TABLET TWICE A DAY  . polyethylene glycol powder (GLYCOLAX/MIRALAX) 17 GM/SCOOP powder SMARTSIG:1 Capful(s) By Mouth Daily  . potassium chloride (KLOR-CON) 10 MEQ tablet TAKE 1 TABLET DAILY  . pravastatin (PRAVACHOL) 40 MG tablet Take 1  tablet (40 mg total) by mouth daily.  . tamsulosin (FLOMAX) 0.4 MG CAPS capsule Take 0.4 mg by mouth daily.  [name] torsemide (DEMADEX) 20 MG tablet Take 1 tablet (20 mg total) by mouth once for 1 dose.     Allergies:   Patient has no known allergies.   Social History   Socioeconomic History  . Marital status: Married    Spouse name: Not on file  . Number of children: Not on file  . Years of education: Not on file  . Highest education level: Not on file  Occupational History  . Not on file  Tobacco Use  . Smoking status: Former Smoker    Packs/day: 0.25    Years: 25.00    Pack years: 6.25    Types: Cigarettes    Quit date: 2010    Years since quitting: 11.6  . Smokeless tobacco: Former Systems developer  Types: [place]ina Ser    Quit date: 2011  Vaping Use  . Vaping Use: Never used  Substance and Sexual Activity  . Alcohol use: No  . Drug use: No  . Sexual activity: Not on file  Other Topics Concern  . Not on file  Social History Narrative  . Not on file   Social Determinants of Health   Financial Resource Strain:   . Difficulty of Paying Living Expenses: Not on file  Food Insecurity:   . Worried About Charity fundraiser in the Last Year: Not on file  . Ran Out of Food in the Last Year: Not on file  Transportation Needs:   . Lack of Transportation (Medical): Not on file  . Lack of Transportation (Non-Medical): Not on file  Physical Activity:   . Days of Exercise per Week: Not on file  . Minutes of Exercise per Session: Not on file  Stress:   . Feeling of Stress : Not on file  Social Connections:   . Frequency of Communication with Friends and Family: Not on file  . Frequency of Social Gatherings with Friends and Family: Not on file  . Attends Religious Services: Not on file  . Active Member of Clubs or Organizations: Not on file  . Attends Archivist Meetings: Not on file  . Marital Status: Not on file     Family History: The patient's family history includes Diabetes  in his mother; Hyperlipidemia in his brother and mother; Hypertension in his brother and father. ROS:   Please see the history of present illness.    All other systems reviewed and are negative.  EKGs/Labs/Other Studies Reviewed:    The following studies were reviewed today:  06/27/2020 creatinine 1.40 TSH 1.43055 2021 cholesterol 109 LDL 57 triglycerides 137 HDL 28.  Recent Labs: No results found for requested labs within last 8760 hours.  Recent Lipid Panel    Component Value Date/Time   CHOL 107 07/15/2019 1149   TRIG 146 07/15/2019 1149   HDL 26 (L) 07/15/2019 1149   CHOLHDL 4.1 07/15/2019 1149   LDLCALC 52 07/15/2019 1149    Physical Exam:    VS:  BP 120/65   Pulse (!) 58   Ht 5\' 11"  (1.803 m)   Wt 230 lb (104.3 kg)   SpO2 97%   BMI 32.08 kg/m     Wt Readings from Last 3 Encounters:  08/04/20 230 lb (104.3 kg)  01/12/20 229 lb 12.8 oz (104.2 kg)  07/15/19 229 lb 3.2 oz (104 kg)     GEN: He looks pale well nourished, well developed in no acute distress HEENT: Normal NECK: No JVD; No carotid bruits LYMPHATICS: No lymphadenopathy CARDIAC: Irregular no murmurs, rubs, gallops RESPIRATORY:  Clear to auscultation without rales, wheezing or rhonchi  ABDOMEN: Soft, non-tender, non-distended MUSCULOSKELETAL:  No edema; No deformity  SKIN: Warm and dry NEUROLOGIC:  Alert and oriented x 3 PSYCHIATRIC:  Normal affect    Signed, Shirlee More, MD  08/04/2020 11:31 AM    [place]

## 2020-08-04 ENCOUNTER — Encounter: Payer: Self-pay | Admitting: Cardiology

## 2020-08-04 ENCOUNTER — Other Ambulatory Visit: Payer: Self-pay

## 2020-08-04 ENCOUNTER — Ambulatory Visit (INDEPENDENT_AMBULATORY_CARE_PROVIDER_SITE_OTHER): Payer: Medicare Other | Admitting: Cardiology

## 2020-08-04 VITALS — BP 120/65 | HR 58 | Ht 71.0 in | Wt 230.0 lb

## 2020-08-04 DIAGNOSIS — I25118 Atherosclerotic heart disease of native coronary artery with other forms of angina pectoris: Secondary | ICD-10-CM

## 2020-08-04 DIAGNOSIS — E785 Hyperlipidemia, unspecified: Secondary | ICD-10-CM

## 2020-08-04 DIAGNOSIS — N183 Chronic kidney disease, stage 3 unspecified: Secondary | ICD-10-CM

## 2020-08-04 DIAGNOSIS — Z7901 Long term (current) use of anticoagulants: Secondary | ICD-10-CM

## 2020-08-04 DIAGNOSIS — I110 Hypertensive heart disease with heart failure: Secondary | ICD-10-CM

## 2020-08-04 DIAGNOSIS — I4821 Permanent atrial fibrillation: Secondary | ICD-10-CM

## 2020-08-04 NOTE — Patient Instructions (Signed)

## 2020-08-11 DIAGNOSIS — K5900 Constipation, unspecified: Secondary | ICD-10-CM | POA: Diagnosis not present

## 2020-08-11 DIAGNOSIS — E785 Hyperlipidemia, unspecified: Secondary | ICD-10-CM | POA: Diagnosis not present

## 2020-08-11 DIAGNOSIS — J449 Chronic obstructive pulmonary disease, unspecified: Secondary | ICD-10-CM | POA: Diagnosis not present

## 2020-08-11 DIAGNOSIS — I480 Paroxysmal atrial fibrillation: Secondary | ICD-10-CM | POA: Diagnosis not present

## 2020-08-11 DIAGNOSIS — K219 Gastro-esophageal reflux disease without esophagitis: Secondary | ICD-10-CM | POA: Diagnosis not present

## 2020-08-11 DIAGNOSIS — Z6832 Body mass index (BMI) 32.0-32.9, adult: Secondary | ICD-10-CM | POA: Diagnosis not present

## 2020-08-11 DIAGNOSIS — D649 Anemia, unspecified: Secondary | ICD-10-CM | POA: Diagnosis not present

## 2020-08-11 DIAGNOSIS — R7303 Prediabetes: Secondary | ICD-10-CM | POA: Diagnosis not present

## 2020-08-11 DIAGNOSIS — N182 Chronic kidney disease, stage 2 (mild): Secondary | ICD-10-CM | POA: Diagnosis not present

## 2020-08-11 DIAGNOSIS — I130 Hypertensive heart and chronic kidney disease with heart failure and stage 1 through stage 4 chronic kidney disease, or unspecified chronic kidney disease: Secondary | ICD-10-CM | POA: Diagnosis not present

## 2020-08-11 DIAGNOSIS — I5032 Chronic diastolic (congestive) heart failure: Secondary | ICD-10-CM | POA: Diagnosis not present

## 2020-08-11 DIAGNOSIS — Z1211 Encounter for screening for malignant neoplasm of colon: Secondary | ICD-10-CM | POA: Diagnosis not present

## 2020-08-16 ENCOUNTER — Encounter: Payer: Self-pay | Admitting: Gastroenterology

## 2020-08-23 DIAGNOSIS — J4520 Mild intermittent asthma, uncomplicated: Secondary | ICD-10-CM | POA: Diagnosis not present

## 2020-08-23 DIAGNOSIS — J301 Allergic rhinitis due to pollen: Secondary | ICD-10-CM | POA: Diagnosis not present

## 2020-08-23 DIAGNOSIS — G4733 Obstructive sleep apnea (adult) (pediatric): Secondary | ICD-10-CM | POA: Diagnosis not present

## 2020-10-19 ENCOUNTER — Telehealth: Payer: Self-pay

## 2020-10-19 ENCOUNTER — Ambulatory Visit (INDEPENDENT_AMBULATORY_CARE_PROVIDER_SITE_OTHER): Payer: Medicare Other | Admitting: Gastroenterology

## 2020-10-19 ENCOUNTER — Encounter: Payer: Self-pay | Admitting: Gastroenterology

## 2020-10-19 VITALS — BP 132/74 | HR 70 | Ht 71.0 in | Wt 233.5 lb

## 2020-10-19 DIAGNOSIS — K625 Hemorrhage of anus and rectum: Secondary | ICD-10-CM

## 2020-10-19 DIAGNOSIS — D500 Iron deficiency anemia secondary to blood loss (chronic): Secondary | ICD-10-CM

## 2020-10-19 NOTE — Patient Instructions (Addendum)
If you are age 73 or older, your body mass index should be between 23-30. Your Body mass index is 32.57 kg/m. If this is out of the aforementioned range listed, please consider follow up with your Primary Care Provider.  If you are age 21 or younger, your body mass index should be between 19-25. Your Body mass index is 32.57 kg/m. If this is out of the aformentioned range listed, please consider follow up with your Primary Care Provider.   It has been recommended to you by your physician that you have a(n) Colonoscopy completed.We did not schedule this today. We have ask your Cardiologist for [place], we will contact you to schedule once we have received this.   Please purchase the following medications over the counter and take as directed: Benefiber 1 tablespoon once daily.   Thank you,  Dr. Jackquline Denmark

## 2020-10-19 NOTE — Telephone Encounter (Signed)
   Primary Cardiologist: Shirlee More, MD  Chart reviewed as part of pre-operative protocol coverage.   Per pharmacy recommendations, patient can hold Savaysa 2-3 days prior to his upcoming colonoscopy with plans to restart the evening of the procedure or the day after at the discretion of his gastroenterologist.   I will route this recommendation to the requesting party via [place] fax function and remove from pre-op pool.  Please call with questions.  Abigail Butts, PA-C 10/19/2020, 3:27 PM

## 2020-10-19 NOTE — Telephone Encounter (Signed)
Patient with diagnosis of A Fib on Sayasa for anticoagulation.    Procedure: colonoscopy Date of procedure: TBD  CHA2DS2-VASc Score = 4  This indicates a 4.8% annual risk of stroke. The patient's score is based upon: CHF History: 1 HTN History: 1 Diabetes History: 0 Stroke History: 0 Vascular Disease History: 1 Age Score: 1 Gender Score: 0    CrCl 71 mL/min using adjusted body weight Platelet count 245K  Per office protocol, patient can hold Savaysa for 2-3 days prior to procedure.   Patient will not need bridging with Lovenox (enoxaparin) around procedure.  If not bridging, patient should restart Savaysa on the evening of procedure or day after, at discretion of procedure MD

## 2020-10-19 NOTE — Telephone Encounter (Signed)
[facility] Pre-operative Risk Assessment     Request for surgical clearance:     Endoscopy Procedure  What type of surgery is being performed?     Colono  When is this surgery scheduled?     Not scheduled yet   What type of clearance is required ?   Pharmacy  Are there any medications that need to be held prior to surgery and how long? [place] name and name of physician performing surgery?      [facility]  What is your office phone and fax number?      Phone- [phone]  [phone]  Anesthesia type (None, local, MAC, general) ?       MAC

## 2020-10-19 NOTE — Progress Notes (Signed)
Chief Complaint: History of rectal bleeding/IDA  Referring Provider:  Renaldo Reel, PA      ASSESSMENT AND PLAN;   #1. Rectal bleeding (resolved). Neg CT AP July 2021 at [facility]  #2. IDA (Hb 14 --> 11.7 in 3-4 yrs). Has associated CKD, dCHF as cause of anemia of chronic disease.  #3. Comorbid conditions include CAD s/p CABG, A. fib on savasya, HTN, dCHF (EF 55- 60%), CKD3, OSA, HLD  Plan: -Benefiber 1TBS po qd -Colon after cardio clearence from Dr Bettina Gavia and holding savasya 24hrs before. I have discussed risks and benefits. -CBC, CMP, celiac at time of colon. -Continue iron supplements. He is higher than baseline risk due to multiple comorbid conditions and AC HPI:    Jason Stark is a 74 y.o. male  With history of CAD s/p CABG, A. fib on savasya, HTN, dCHF (EF 55- 60%), CKD3, OSA, HLD  With history of bright red blood per rectum x 1 week which is resolved. Initially he was constipated and had to strain. He denies having any abdominal or rectal pain. He denies having any nausea, vomiting. His reflux symptoms are much better on omeprazole.  No fever chills or night sweats. No recent weight loss.  Found to have Hb 11.7 with normal MCV at 96. Iron studies however showed iron deficiency with iron 30, saturation 8%, TIBC 359. He had normal B12. Most recent creatinine was 1.27 with a GFR 56 mL/min.  Sent to GI clinic for further evaluation possibly by means of colonoscopy.  I have looked at his previous records. His hemoglobin in 2017 was 14.4.  Previous GI work-up: -CT Abdo/pelvis 06/28/2020 - for any acute abnormalities except for fatty liver, atherosclerosis. Performed at [facility]. I have reviewed the films. -EGD 07/23/2017: Mild gastritis, small transient HH. Has positive CLOtest, treated with triple drug therapy. -Colonoscopy 05/28/2017 ([facility]): Colonic polyp s/p polypectomy, moderate predominantly sigmoid diverticulosis, small internal hemorrhoids. Bx-tubular  adenoma.  Past Medical History:  Diagnosis Date  . Abnormal myocardial perfusion study 03/11/2016   Overview:  Extensive lateral ischemia , [name] 44% in distribution of SVG to [facility] and M  . Abnormal stress test 03/21/2016  . Blood glucose elevated 08/27/2017  . CAD (coronary artery disease), native coronary artery 06/13/2015   Overview:  Cardiac cath 03/13/16: Diagnostic Summary Severe native multivessel disease as described below Patent LIMA to LAD Patent SVG to PDA Occluded SVG to OM/OM2 ( jump graft) Occluded SVG to Diagonal Normal LV function Diagnostic Recommendations PCI to the Proximal LCX Medical therapy for the other disease Interventional Summary Successful PCI to the proximaL LCX with Xience 3.0 x 28 post dilated with 3.5 x 15 [place] euphora  . Cardiomegaly 08/27/2017  . Chronic anticoagulation 12/19/2015   Overview:  Edoxaban started 12/14/15  . Chronic diastolic heart failure ([place]) 06/13/2015  . Dyslipidemia 06/13/2015  . Esophageal reflux 08/27/2017  . Gilbert syndrome 08/27/2017  . High risk medication use 06/13/2015   Overview:  Sotolol  . History of colon polyps   . Hyperlipidemia 08/27/2017  . Hypertensive heart disease with heart failure ([place]) 06/13/2015  . OSA (obstructive sleep apnea) 08/27/2017  . Osteoarthritis 08/27/2017  . Persistent atrial fibrillation ([place]) 06/13/2015  . Rheumatic disease of mitral valve 08/27/2017  . Rheumatic heart failure (congestive) ([place]) 08/27/2017  . SBO (small bowel obstruction) (HCC)    partial s/p x-lap due to adhesions (Dr Sherald Hess) followed by incisional hernia s/p repair.    Past Surgical History:  Procedure Laterality Date  .  A FLUTTER ABLATION     a tach/flutter  . CARDIAC SURGERY  2012  . CARDIOVERSION  01/19/2016  . CHOLECYSTECTOMY    . COLONOSCOPY W/ POLYPECTOMY  05/28/2017   Colonic polyp status post polypectomy. Moderate predominantly sigmoid diverticulosis. Small internal hemorrhoids (likely etiology of heme-positive stools- rule out other  causes)  . CORONARY ANGIOPLASTY WITH STENT PLACEMENT    . CORONARY ARTERY BYPASS GRAFT  2011   with Maze and LAA ligation   . CORONARY STENT PLACEMENT  03/2016  . ESOPHAGOGASTRODUODENOSCOPY  07/23/2017   Mild gastritis. Small transient hiatal hernia. Otherwise normal EGD  . HERNIA REPAIR    . KNEE SURGERY    . PROSTATE SURGERY    . RECTAL SURGERY    . TOTAL KNEE ARTHROPLASTY Right 02/2019    Family History  Problem Relation Age of Onset  . Hypertension Father   . Diabetes Mother   . Hyperlipidemia Mother   . Hypertension Brother   . Hyperlipidemia Brother   . Colon cancer Neg Hx   . Esophageal cancer Neg Hx     Social History   Tobacco Use  . Smoking status: Former Smoker    Packs/day: 0.25    Years: 25.00    Pack years: 6.25    Types: Cigarettes    Quit date: 2010    Years since quitting: 11.8  . Smokeless tobacco: Former Systems developer    Types: [place] date: 2011  Vaping Use  . Vaping Use: Never used  Substance Use Topics  . Alcohol use: No  . Drug use: No    Current Outpatient Medications  Medication Sig Dispense Refill  . acetaminophen (TYLENOL 8 HOUR ARTHRITIS PAIN) 650 MG CR tablet Take 1,300 mg by mouth 2 (two) times daily as needed.     [name] amLODipine-benazepril (LOTREL) 5-20 MG capsule Take 1 capsule by mouth 2 (two) times daily.     . Cholecalciferol (VITAMIN D-3) 125 MCG (5000 UT) TABS Take 1 tablet by mouth daily.    [name] edoxaban (SAVAYSA) 60 MG TABS tablet TAKE 1 TABLET DAILY 90 tablet 3  . ezetimibe (ZETIA) 10 MG tablet TAKE 1 TABLET DAILY 90 tablet 3  . famotidine (PEPCID) 20 MG tablet Take 20 mg by mouth daily.    . Ferrous Sulfate (IRON PO) Take 2 tablets by mouth daily.    . fluticasone (FLONASE) 50 MCG/ACT nasal spray 1 spray by Each Nare route daily as needed for Rhinitis.    [name] loratadine (CLARITIN) 10 MG tablet Take 10 mg by mouth daily.     . metoprolol tartrate (LOPRESSOR) 25 MG tablet TAKE 1 TABLET TWICE A DAY 180 tablet 3  . omeprazole  (PRILOSEC) 40 MG capsule Take 40 mg by mouth daily.    . polyethylene glycol powder (GLYCOLAX/MIRALAX) 17 GM/SCOOP powder SMARTSIG:1 Capful(s) By Mouth Daily    . potassium chloride (KLOR-CON) 10 MEQ tablet TAKE 1 TABLET DAILY 90 tablet 3  . pravastatin (PRAVACHOL) 40 MG tablet Take 1 tablet (40 mg total) by mouth daily. 90 tablet 3  . tamsulosin (FLOMAX) 0.4 MG CAPS capsule Take 0.4 mg by mouth daily.    . hydrocortisone (ANUSOL-HC) 2.5 % rectal cream Apply topically 2 (two) times daily. (Patient not taking: Reported on 10/19/2020)     No current facility-administered medications for this visit.    No Known Allergies  Review of Systems:  Constitutional: Denies fever, chills, diaphoresis, appetite change and fatigue.  HEENT: Has allergies. He has also  has hearing problems, Respiratory: Denies SOB, DOE, cough, chest tightness,  and wheezing.   Cardiovascular: Denies chest pain, palpitations and leg swelling.  Genitourinary: Denies dysuria, urgency, frequency, hematuria, flank pain and difficulty urinating.  Musculoskeletal: has myalgias, back pain, joint swelling, arthralgias and gait problem.  Skin: No rash.  Neurological: Denies dizziness, seizures, syncope, weakness, light-headedness, numbness and headaches.  Hematological: Denies adenopathy. Easy bruising, personal or family bleeding history  Psychiatric/Behavioral: has anxiety or depression, sleeping problems.     Physical Exam:    BP 132/74   Pulse 70   Ht 5\' 11"  (1.803 m)   Wt 233 lb 8 oz (105.9 kg)   BMI 32.57 kg/m  Wt Readings from Last 3 Encounters:  10/19/20 233 lb 8 oz (105.9 kg)  08/04/20 230 lb (104.3 kg)  01/12/20 229 lb 12.8 oz (104.2 kg)   Constitutional:  Well-developed, in no acute distress. Psychiatric: Normal mood and affect. Behavior is normal. HEENT: Pupils normal.  Conjunctivae are normal. No scleral icterus. Neck supple.  Cardiovascular: Normal rate, regular rhythm. No edema Pulmonary/chest: Effort  normal and breath sounds normal. No wheezing, rales or rhonchi. Abdominal: Soft, nondistended. Nontender. Bowel sounds active throughout. There are no masses palpable. No hepatomegaly. Rectal: to be performed at the time of colonoscopy. Neurological: Alert and oriented to person place and time. Skin: Skin is warm and dry. No rashes noted.  Data Reviewed: I have personally reviewed following labs and imaging studies  CBC: No flowsheet data found.  CMP: CMP Latest Ref Rng & Units 07/15/2019 06/17/2019  Glucose 65 - 99 mg/dL 103(H) 108(H)  BUN 8 - 27 mg/dL 16 14  Creatinine 0.76 - 1.27 mg/dL 1.47(H) 1.38(H)  Sodium 134 - 144 mmol/L 145(H) 144  Potassium 3.5 - 5.2 mmol/L 4.2 3.8  Chloride 96 - 106 mmol/L 100 101  CO2 20 - 29 mmol/L 28 28  Calcium 8.6 - 10.2 mg/dL 9.5 9.5  Total Protein 6.0 - 8.5 g/dL 6.6 -  Total Bilirubin 0.0 - 1.2 mg/dL 2.0(H) -  Alkaline Phos 39 - 117 IU/L 62 -  AST 0 - 40 IU/L 15 -  ALT 0 - 44 IU/L 14 -     Carmell Austria, MD 10/19/2020, 10:13 AM  Cc: Renaldo Reel, PA

## 2020-11-08 ENCOUNTER — Telehealth: Payer: Self-pay

## 2020-11-08 DIAGNOSIS — D500 Iron deficiency anemia secondary to blood loss (chronic): Secondary | ICD-10-CM

## 2020-11-08 DIAGNOSIS — K625 Hemorrhage of anus and rectum: Secondary | ICD-10-CM

## 2020-11-08 NOTE — Telephone Encounter (Signed)
.  A user error has taken place: error

## 2020-11-08 NOTE — Telephone Encounter (Signed)
For this patient's upcoming procedure I needed verification if patient will hold Savaysa for 2 or 3 days prior to colonoscopy. As of 10-19-2020 he was cleared but I just need an exact day hold please?

## 2020-11-09 NOTE — Telephone Encounter (Signed)
Hold Savaysa 3 days before please RG

## 2020-11-09 NOTE — Telephone Encounter (Signed)
Can you please tell me for this patient if you want to hold his Savaysa 2 days prior to having a procedure? We got a clearance to hold it 2-3 days

## 2020-11-09 NOTE — Telephone Encounter (Signed)
Dr. Bettina Gavia approved for a 2 or 3 day hold. We would prefer a 2 day hold, but thee days may be used if holding for less time would significantly increase morbidity or mortality. It would be up to the surgeon to decide.

## 2020-11-10 NOTE — Telephone Encounter (Signed)
Called patient and scheduled for 12/28/2020 at 11am at the [facility] and told him I would mail instructions and to call back the first week of January to let us know if he haven't received instructions. Miralax prep. Patient said he isnt on oxygen but does have a CPAP machine. Instructions are mailed

## 2020-12-18 ENCOUNTER — Other Ambulatory Visit: Payer: Self-pay | Admitting: Cardiology

## 2020-12-20 DIAGNOSIS — D649 Anemia, unspecified: Secondary | ICD-10-CM | POA: Diagnosis not present

## 2020-12-20 DIAGNOSIS — R7303 Prediabetes: Secondary | ICD-10-CM | POA: Diagnosis not present

## 2020-12-20 DIAGNOSIS — E785 Hyperlipidemia, unspecified: Secondary | ICD-10-CM | POA: Diagnosis not present

## 2020-12-20 DIAGNOSIS — I130 Hypertensive heart and chronic kidney disease with heart failure and stage 1 through stage 4 chronic kidney disease, or unspecified chronic kidney disease: Secondary | ICD-10-CM | POA: Diagnosis not present

## 2020-12-28 ENCOUNTER — Ambulatory Visit (AMBULATORY_SURGERY_CENTER): Payer: Medicare Other | Admitting: Gastroenterology

## 2020-12-28 ENCOUNTER — Other Ambulatory Visit (INDEPENDENT_AMBULATORY_CARE_PROVIDER_SITE_OTHER): Payer: Medicare Other

## 2020-12-28 ENCOUNTER — Encounter: Payer: Self-pay | Admitting: Gastroenterology

## 2020-12-28 ENCOUNTER — Other Ambulatory Visit: Payer: Self-pay

## 2020-12-28 VITALS — BP 120/78 | HR 64 | Temp 98.4°F | Resp 17 | Ht 71.0 in | Wt 233.0 lb

## 2020-12-28 DIAGNOSIS — D122 Benign neoplasm of ascending colon: Secondary | ICD-10-CM | POA: Diagnosis not present

## 2020-12-28 DIAGNOSIS — D124 Benign neoplasm of descending colon: Secondary | ICD-10-CM

## 2020-12-28 DIAGNOSIS — K5730 Diverticulosis of large intestine without perforation or abscess without bleeding: Secondary | ICD-10-CM

## 2020-12-28 DIAGNOSIS — K648 Other hemorrhoids: Secondary | ICD-10-CM

## 2020-12-28 DIAGNOSIS — K625 Hemorrhage of anus and rectum: Secondary | ICD-10-CM

## 2020-12-28 DIAGNOSIS — D508 Other iron deficiency anemias: Secondary | ICD-10-CM

## 2020-12-28 DIAGNOSIS — D123 Benign neoplasm of transverse colon: Secondary | ICD-10-CM | POA: Diagnosis not present

## 2020-12-28 LAB — CBC WITH DIFFERENTIAL/PLATELET
Basophils Absolute: 0.1 10*3/uL (ref 0.0–0.1)
Basophils Relative: 1.1 % (ref 0.0–3.0)
Eosinophils Absolute: 0.1 10*3/uL (ref 0.0–0.7)
Eosinophils Relative: 0.9 % (ref 0.0–5.0)
HCT: 43.4 % (ref 39.0–52.0)
Hemoglobin: 15 g/dL (ref 13.0–17.0)
Lymphocytes Relative: 21.5 % (ref 12.0–46.0)
Lymphs Abs: 1.4 10*3/uL (ref 0.7–4.0)
MCHC: 34.5 g/dL (ref 30.0–36.0)
MCV: 91.3 fl (ref 78.0–100.0)
Monocytes Absolute: 0.5 10*3/uL (ref 0.1–1.0)
Monocytes Relative: 7.9 % (ref 3.0–12.0)
Neutro Abs: 4.4 10*3/uL (ref 1.4–7.7)
Neutrophils Relative %: 68.6 % (ref 43.0–77.0)
Platelets: 199 10*3/uL (ref 150.0–400.0)
RBC: 4.75 Mil/uL (ref 4.22–5.81)
RDW: 14.2 % (ref 11.5–15.5)
WBC: 6.4 10*3/uL (ref 4.0–10.5)

## 2020-12-28 LAB — COMPREHENSIVE METABOLIC PANEL
ALT: 17 U/L (ref 0–53)
AST: 16 U/L (ref 0–37)
Albumin: 4.3 g/dL (ref 3.5–5.2)
Alkaline Phosphatase: 53 U/L (ref 39–117)
BUN: 12 mg/dL (ref 6–23)
CO2: 31 mEq/L (ref 19–32)
Calcium: 9.3 mg/dL (ref 8.4–10.5)
Chloride: 101 mEq/L (ref 96–112)
Creatinine, Ser: 1.41 mg/dL (ref 0.40–1.50)
GFR: 49.58 mL/min — ABNORMAL LOW (ref >60.00)
Glucose, Bld: 113 mg/dL — ABNORMAL HIGH (ref 70–99)
Potassium: 3.4 mEq/L — ABNORMAL LOW (ref 3.5–5.1)
Sodium: 138 mEq/L (ref 135–145)
Total Bilirubin: 2.8 mg/dL — ABNORMAL HIGH (ref 0.2–1.2)
Total Protein: 7 g/dL (ref 6.0–8.3)

## 2020-12-28 MED ORDER — SODIUM CHLORIDE 0.9 % IV SOLN: 500.0000 mL | Freq: Once | INTRAVENOUS | Status: DC

## 2020-12-28 NOTE — Op Note (Signed)
[place] Patient Name: Jason Stark Procedure Date: 12/28/2020 10:44 AM MRN: [id number] Endoscopist: Jackquline Denmark , MD Age: 75 Referring MD:  Date of Birth: [birth year] Gender: Male Account #: [id number] Procedure:                Colonoscopy Indications:              Rectal bleeding with IDA Medicines:                Monitored Anesthesia Care Procedure:                Pre-Anesthesia Assessment:                           - Prior to the procedure, a History and Physical                            was performed, and patient medications and                            allergies were reviewed. The patient's tolerance of                            previous anesthesia was also reviewed. The risks                            and benefits of the procedure and the sedation                            options and risks were discussed with the patient.                            All questions were answered, and informed consent                            was obtained. Prior Anticoagulants: The patient has                            taken Savaysa, last dose was 3 days prior to                            procedure. ASA Grade Assessment: III - A patient                            with severe systemic disease. After reviewing the                            risks and benefits, the patient was deemed in                            satisfactory condition to undergo the procedure.                           After obtaining informed consent, the colonoscope  was passed under direct vision. Throughout the                            procedure, the patient's blood pressure, pulse, and                            oxygen saturations were monitored continuously. The                            Olympus [id number] Colonoscope was                            introduced through the anus and advanced to the 2                            cm into the ileum. The colonoscopy  was performed                            without difficulty. The patient tolerated the                            procedure well. The quality of the bowel                            preparation was good. The terminal ileum, ileocecal                            valve, appendiceal orifice, and rectum were                            photographed. Scope In: 10:53:06 AM Scope Out: 11:10:07 AM Scope Withdrawal Time: 0 hours 11 minutes 36 seconds  Total Procedure Duration: 0 hours 17 minutes 1 second  Findings:                 Four sessile polyps were found in the proximal                            transverse colon (1) and proximal ascending colon                            (3). The polyps were 4 to 6 mm in size. These                            polyps were removed with a cold snare. Resection                            and retrieval were complete.                           Three sessile polyps were found in the descending                            colon (2) and distal transverse colon (1). The  polyps were 4 to 6 mm in size. These polyps were                            removed with a cold snare. Resection and retrieval                            were complete.                           Multiple medium-mouthed diverticula were found in                            the sigmoid colon and rare in ascending colon.                           Non-bleeding internal hemorrhoids were found during                            retroflexion. The hemorrhoids were moderate.                           The terminal ileum appeared normal.                           The exam was otherwise without abnormality on                            direct and retroflexion views. Complications:            No immediate complications. Estimated Blood Loss:     Estimated blood loss: none. Impression:               - Colonic polyps s/p polypectomy.                           - Moderate predominantly sigmoid  diverticulosis.                           - Non-bleeding internal hemorrhoids.                           - The examined portion of the ileum was normal.                           - The examination was otherwise normal on direct                            and retroflexion views. Recommendation:           - Patient has a contact number available for                            emergencies. The signs and symptoms of potential                            delayed complications were discussed with the  patient. Return to normal activities tomorrow.                            Written discharge instructions were provided to the                            patient.                           - High fiber diet.                           - Continue present medications.                           - Resume iron from tomorrow onwards.                           - Await pathology results.                           - Resume Savaysa at prior dose tomorrow.                           - Return to GI clinic PRN.                           - CBC, CMP and celiac today as planned. Orders                            placed.                           - The findings and recommendations were discussed                            with the patient's family. Jackquline Denmark, MD 12/28/2020 11:22:47 AM This report has been signed electronically.

## 2020-12-28 NOTE — Progress Notes (Signed)
Per Kassie Mends, CRNA, he placed pt on 3 liters of O2 on admission to  the recovery room d/t pt's cardiac hx.  Once pt was waking up, O2 was d/c and room air pt's sats were 94 percent.    Pt to be escorted to the lab on discharge for blood work - CBC, CMP, and Celiac Panel.  No problems noted in the recovery room.

## 2020-12-28 NOTE — Progress Notes (Signed)
Called to room to assist during endoscopic procedure.  Patient ID and intended procedure confirmed with present staff. Received instructions for my participation in the procedure from the performing physician.  

## 2020-12-28 NOTE — Progress Notes (Signed)
PT taken to PACU. Monitors in place. VSS. Report given to RN. 

## 2020-12-28 NOTE — Progress Notes (Signed)
Lab orders entered per post op- [facility]

## 2020-12-28 NOTE — Progress Notes (Signed)
Vitals by CW 

## 2020-12-28 NOTE — Patient Instructions (Addendum)
Handouts were given to you on polyps, diverticulosis, hemorrhoids and a high fiber diet with liberal fluid intake. [facility] and [facility] - January 21,2022. You may resume your other current medications today. On discharge, we escorted you to the lab for blood work. CBC, CMP and Celiac Panel. Await biopsy results.  May take 1-3 weeks to receive pathology results. Please call if any questions or concerns.    YOU HAD AN ENDOSCOPIC PROCEDURE TODAY AT [facility]:   Refer to the procedure report that was given to you for any specific questions about what was found during the examination.  If the procedure report does not answer your questions, please call your gastroenterologist to clarify.  If you requested that your care partner not be given the details of your procedure findings, then the procedure report has been included in a sealed envelope for you to review at your convenience later.  YOU SHOULD EXPECT: Some feelings of bloating in the abdomen. Passage of more gas than usual.  Walking can help get rid of the air that was put into your GI tract during the procedure and reduce the bloating. If you had a lower endoscopy (such as a colonoscopy or flexible sigmoidoscopy) you may notice spotting of blood in your stool or on the toilet paper. If you underwent a bowel prep for your procedure, you may not have a normal bowel movement for a few days.  Please Note:  You might notice some irritation and congestion in your nose or some drainage.  This is from the oxygen used during your procedure.  There is no need for concern and it should clear up in a day or so.  SYMPTOMS TO REPORT IMMEDIATELY:   Following lower endoscopy (colonoscopy or flexible sigmoidoscopy):  Excessive amounts of blood in the stool  Significant tenderness or worsening of abdominal pains  Swelling of the abdomen that is new, acute  Fever of 100F or higher   For urgent or emergent issues, a  gastroenterologist can be reached at any hour by calling [phone]. Do not use MyChart messaging for urgent concerns.    DIET:  We do recommend a small meal at first, but then you may proceed to your regular diet.  Drink plenty of fluids but you should avoid alcoholic beverages for 24 hours.  ACTIVITY:  You should plan to take it easy for the rest of today and you should NOT DRIVE or use heavy machinery until tomorrow (because of the sedation medicines used during the test).    FOLLOW UP: Our staff will call the number listed on your records 48-72 hours following your procedure to check on you and address any questions or concerns that you may have regarding the information given to you following your procedure. If we do not reach you, we will leave a message.  We will attempt to reach you two times.  During this call, we will ask if you have developed any symptoms of COVID 19. If you develop any symptoms (ie: fever, flu-like symptoms, shortness of breath, cough etc.) before then, please call [phone].  If you test positive for Covid 19 in the 2 weeks post procedure, please call and report this information to [place].    If any biopsies were taken you will be contacted by phone or by letter within the next 1-3 weeks.  Please call us at [phone] if you have not heard about the biopsies in 3 weeks.    SIGNATURES/CONFIDENTIALITY: You  and/or your care partner have signed paperwork which will be entered into your electronic medical record.  These signatures attest to the fact that that the information above on your After Visit Summary has been reviewed and is understood.  Full responsibility of the confidentiality of this discharge information lies with you and/or your care-partner.

## 2020-12-30 DIAGNOSIS — U071 COVID-19: Secondary | ICD-10-CM | POA: Diagnosis not present

## 2020-12-30 DIAGNOSIS — Z20822 Contact with and (suspected) exposure to covid-19: Secondary | ICD-10-CM | POA: Diagnosis not present

## 2020-12-30 LAB — CELIAC PANEL 10
Antigliadin Abs, IgA: 4 units (ref 0–19)
Endomysial IgA: NEGATIVE
Gliadin IgG: 3 units (ref 0–19)
IgA/Immunoglobulin A, Serum: 328 mg/dL (ref 61–437)
Tissue Transglut Ab: <2 U/mL (ref 0–5)
Transglutaminase IgA: <2 U/mL (ref 0–3)

## 2021-01-01 ENCOUNTER — Telehealth: Payer: Self-pay

## 2021-01-01 NOTE — Telephone Encounter (Signed)
  Follow up Call-  Call back number 12/28/2020  Post procedure Call Back phone  # [phone]  Permission to leave phone message Yes  Some recent data might be hidden     Patient questions:  Do you have a fever, pain , or abdominal swelling? No. Pain Score  0 *  Have you tolerated food without any problems? Yes.    Have you been able to return to your normal activities? No.  Do you have any questions about your discharge instructions: Diet   No. Medications  No. Follow up visit  No.  Do you have questions or concerns about your Care? Yes.    Actions: * If pain score is 4 or above: No action needed, pain <4.  1. Have you developed a fever since your procedure? No.  Pt said neither he or his wife had a fever.  2.   Have you had an respiratory symptoms (SOB or cough) since your procedure? Yes. Cough began on Saturday 12-30-20.. Nasal congestion also.  No other sx noted.  3.   Have you tested positive for COVID 19 since your procedure  Per pt he was seen at Urgent Care in [place], [place] Saturday 12-30-20.  4.   Have you had any family members/close contacts diagnosed with the COVID 19 since your procedure?  Yes.  Pt siad his wife also tested positive to COVID on 12-30-20.   If yes to any of these questions please route to Joylene John, RN and Joella Prince, RN

## 2021-01-04 LAB — RETICULIN ANTIBODIES, IGA W TITER: Reticulin IgA Screen: NEGATIVE

## 2021-01-04 LAB — GLIADIN ANTIBODIES, SERUM
Gliadin IgA: <1 U/mL
Gliadin IgG: <1 U/mL

## 2021-01-04 LAB — TISSUE TRANSGLUTAMINASE, IGA: (tTG) Ab, IgA: <1 U/mL

## 2021-01-06 ENCOUNTER — Encounter: Payer: Self-pay | Admitting: Gastroenterology

## 2021-01-08 ENCOUNTER — Telehealth: Payer: Self-pay | Admitting: Gastroenterology

## 2021-01-08 ENCOUNTER — Other Ambulatory Visit: Payer: Self-pay | Admitting: Gastroenterology

## 2021-01-08 MED ORDER — POTASSIUM CHLORIDE ER 10 MEQ PO TBCR: 20.0000 meq | EXTENDED_RELEASE_TABLET | Freq: Every day | ORAL | 2 refills | Status: DC

## 2021-01-08 NOTE — Telephone Encounter (Signed)
Would you like to send prescription for Potassium for 90 day supply?

## 2021-01-08 NOTE — Telephone Encounter (Signed)
Pt is requesting a 90 day supply for his potasium.  [facility].

## 2021-01-10 NOTE — Telephone Encounter (Signed)
Please go ahead and do 90-day supply. Then can get it from primary care RG

## 2021-01-11 ENCOUNTER — Other Ambulatory Visit: Payer: Self-pay | Admitting: Cardiology

## 2021-01-11 MED ORDER — POTASSIUM CHLORIDE ER 10 MEQ PO TBCR: 20.0000 meq | EXTENDED_RELEASE_TABLET | Freq: Every day | ORAL | 0 refills | Status: DC

## 2021-01-11 NOTE — Telephone Encounter (Signed)
I have sent prescription to patients pharmacy, and left a message for patient.

## 2021-01-11 NOTE — Telephone Encounter (Signed)
Refill sent to pharmacy.   

## 2021-01-15 DIAGNOSIS — Z125 Encounter for screening for malignant neoplasm of prostate: Secondary | ICD-10-CM | POA: Diagnosis not present

## 2021-01-15 DIAGNOSIS — N200 Calculus of kidney: Secondary | ICD-10-CM | POA: Diagnosis not present

## 2021-01-15 DIAGNOSIS — N401 Enlarged prostate with lower urinary tract symptoms: Secondary | ICD-10-CM | POA: Diagnosis not present

## 2021-01-19 ENCOUNTER — Other Ambulatory Visit: Payer: Self-pay | Admitting: Cardiology

## 2021-01-19 NOTE — Telephone Encounter (Signed)
Refill for Torsemide 20 mg tablet sent to [facility]

## 2021-01-19 NOTE — Telephone Encounter (Signed)
Age 75, weight 105.7kg, SCr 1.41 on 12/28/20, CrCl 70 Afib indication, last visit 07/2020

## 2021-01-26 DIAGNOSIS — I10 Essential (primary) hypertension: Secondary | ICD-10-CM | POA: Insufficient documentation

## 2021-01-26 DIAGNOSIS — H269 Unspecified cataract: Secondary | ICD-10-CM | POA: Insufficient documentation

## 2021-01-26 DIAGNOSIS — Z86010 Personal history of colonic polyps: Secondary | ICD-10-CM | POA: Insufficient documentation

## 2021-01-26 DIAGNOSIS — G4730 Sleep apnea, unspecified: Secondary | ICD-10-CM | POA: Insufficient documentation

## 2021-01-26 DIAGNOSIS — K56609 Unspecified intestinal obstruction, unspecified as to partial versus complete obstruction: Secondary | ICD-10-CM | POA: Insufficient documentation

## 2021-02-05 NOTE — Progress Notes (Signed)
Cardiology Office Note:    Date:  02/06/2021   ID:  Jason Stark, DOB 1946/11/24, MRN [id number]  PCP:  Renaldo Reel, PA  Cardiologist:  Shirlee More, MD    Referring MD: Renaldo Reel, PA    ASSESSMENT:    1. Permanent atrial fibrillation ([place])   2. Chronic anticoagulation   3. Hypertensive heart disease with heart failure (HCC)   4. Stage 3 chronic kidney disease, unspecified whether stage 3a or 3b CKD ([place])   5. Coronary artery disease of native artery of native heart with stable angina pectoris ([place])   6. Hyperlipidemia, unspecified hyperlipidemia type    PLAN:    In order of problems listed above:  1. Overall from my perspective he is doing well his A. fib is controlled reduce his beta-blocker dose continue anticoagulant. 2. Continue current anticoagulant following GI evaluation removal of polyps 3. Stable blood pressure is at target he has no fluid overload on his current diuretic and takes potassium supplement 4. Stable CKD managed by his PCP 5. Stable CAD having no anginal discomfort continue medical therapy including lipid-lowering with Zetia plus statin 6. Stable lipids at target continue his current combined therapy   Next appointment: 6 months   Medication Adjustments/Labs and Tests Ordered: Current medicines are reviewed at length with the patient today.  Concerns regarding medicines are outlined above.  No orders of the defined types were placed in this encounter.  No orders of the defined types were placed in this encounter.   Chief Complaint  Patient presents with  . Follow-up  . Atrial Fibrillation  . Congestive Heart Failure  . Hyperlipidemia    History of Present Illness:    Jason Stark is a 75 y.o. male with a hx of CAD CHF chronic atrial fibrillation dyslipidemia status post CABG as well as PCI native left circumflex 03/13/2016 with drug-eluting Xience stent.  Echocardiogram 01/01/2019 normal left ventricular size EF 55 to 60%  with mild aortic and mitral regurgitation moderate LVH.  He was last seen 08/04/2020 with ongoing rectal bleeding and I told him to transiently interrupt his anticoagulation awaiting GI evaluation.  Compliance with diet, lifestyle and medications: Yes  He tells me he had colonoscopy multiple polyps and is back on his anticoagulant without any further bleeding. He complains of chronic back pain and has a marked kyphosis. He also tells me he has renal cyst but was told that its not a problem.  He is not having hematuria or flank pain. He is having no edema shortness of breath chest pain palpitation or syncope.  Recent labs 12/20/2020 primary care physician [facility]: Cholesterol lipids at target total cholesterol 115 LDL 65 triglycerides 144 HDL 25 A1c 6.3% creatinine is elevated at 1.41. Past Medical History:  Diagnosis Date  . Abnormal myocardial perfusion study 03/11/2016   Overview:  Extensive lateral ischemia , [name] 44% in distribution of SVG to [facility] and M  . Abnormal stress test 03/21/2016  . Blood glucose elevated 08/27/2017  . CAD (coronary artery disease), native coronary artery 06/13/2015   Overview:  Cardiac cath 03/13/16: Diagnostic Summary Severe native multivessel disease as described below Patent LIMA to LAD Patent SVG to PDA Occluded SVG to OM/OM2 ( jump graft) Occluded SVG to Diagonal Normal LV function Diagnostic Recommendations PCI to the Proximal LCX Medical therapy for the other disease Interventional Summary Successful PCI to the proximaL LCX with Xience 3.0 x 28 post dilated with 3.5 x 15 [place] euphora  . Cardiomegaly 08/27/2017  .  Cataract   . Chronic anticoagulation 12/19/2015   Overview:  Edoxaban started 12/14/15  . Chronic diastolic heart failure ([place]) 06/13/2015  . Dyslipidemia 06/13/2015  . Esophageal reflux 08/27/2017  . Gilbert syndrome 08/27/2017  . High risk medication use 06/13/2015   Overview:  Sotolol  . History of colon polyps   . Hyperlipidemia 08/27/2017  .  Hypertension   . Hypertensive heart disease with heart failure ([place]) 06/13/2015  . OSA (obstructive sleep apnea) 08/27/2017  . Osteoarthritis 08/27/2017  . Persistent atrial fibrillation ([place]) 06/13/2015  . Rheumatic disease of mitral valve 08/27/2017  . Rheumatic heart failure (congestive) ([place]) 08/27/2017  . SBO (small bowel obstruction) (HCC)    partial s/p x-lap due to adhesions (Dr Sherald Hess) followed by incisional hernia s/p repair.  . Sleep apnea     Past Surgical History:  Procedure Laterality Date  . A FLUTTER ABLATION     a tach/flutter  . CARDIAC SURGERY  2012  . CARDIOVERSION  01/19/2016  . CATARACT EXTRACTION    . CHOLECYSTECTOMY    . COLONOSCOPY W/ POLYPECTOMY  05/28/2017   Colonic polyp status post polypectomy. Moderate predominantly sigmoid diverticulosis. Small internal hemorrhoids (likely etiology of heme-positive stools- rule out other causes)  . CORONARY ANGIOPLASTY WITH STENT PLACEMENT    . CORONARY ARTERY BYPASS GRAFT  2011   with [place] and LAA ligation   . CORONARY STENT PLACEMENT  03/2016  . ESOPHAGOGASTRODUODENOSCOPY  07/23/2017   Mild gastritis. Small transient hiatal hernia. Otherwise normal EGD  . HERNIA REPAIR    . KNEE SURGERY    . PROSTATE SURGERY    . RECTAL SURGERY    . TOTAL KNEE ARTHROPLASTY Right 02/2019    Current Medications: Current Meds  Medication Sig  . acetaminophen (TYLENOL) 650 MG CR tablet Take 1,300 mg by mouth 2 (two) times daily as needed for pain.  [name] amLODipine-benazepril (LOTREL) 5-20 MG capsule Take 1 capsule by mouth 2 (two) times daily.   . Cholecalciferol (VITAMIN D-3) 125 MCG (5000 UT) TABS Take 1 tablet by mouth daily.  [name] edoxaban (SAVAYSA) 60 MG TABS tablet Take 60 mg by mouth daily.  [name] ezetimibe (ZETIA) 10 MG tablet Take 10 mg by mouth daily.  . famotidine (PEPCID) 20 MG tablet Take 20 mg by mouth daily.  . ferrous sulfate 325 (65 FE) MG tablet Take 324 mg by mouth daily.  . fluticasone (FLONASE) 50 MCG/ACT nasal spray  1 spray by Each Nare route daily as needed for Rhinitis.  . furosemide (LASIX) 20 MG tablet Take 20 mg by mouth daily.  [name] loratadine (CLARITIN) 10 MG tablet Take 10 mg by mouth daily.   . metoprolol tartrate (LOPRESSOR) 25 MG tablet TAKE 1 TABLET TWICE A DAY  . omeprazole (PRILOSEC) 40 MG capsule Take 40 mg by mouth daily.  . polyethylene glycol powder (GLYCOLAX/MIRALAX) 17 GM/SCOOP powder Take 0.5 Containers by mouth daily.  . potassium chloride (KLOR-CON) 10 MEQ tablet Take 2 tablets (20 mEq total) by mouth daily.  . potassium chloride (KLOR-CON) 10 MEQ tablet Take 20 mEq by mouth daily.  . pravastatin (PRAVACHOL) 40 MG tablet Take 1 tablet (40 mg total) by mouth daily.  . tamsulosin (FLOMAX) 0.4 MG CAPS capsule Take 0.4 mg by mouth daily.     Allergies:   Patient has no known allergies.   Social History   Socioeconomic History  . Marital status: Married    Spouse name: Not on file  . Number of children: Not on file  .  Years of education: Not on file  . Highest education level: Not on file  Occupational History  . Not on file  Tobacco Use  . Smoking status: Former Smoker    Packs/day: 0.25    Years: 25.00    Pack years: 6.25    Types: Cigarettes    Quit date: 2010    Years since quitting: 12.1  . Smokeless tobacco: Former Systems developer    Types: [place] date: 2011  Vaping Use  . Vaping Use: Never used  Substance and Sexual Activity  . Alcohol use: No  . Drug use: No  . Sexual activity: Not on file  Other Topics Concern  . Not on file  Social History Narrative  . Not on file   Social Determinants of Health   Financial Resource Strain: Not on file  Food Insecurity: Not on file  Transportation Needs: Not on file  Physical Activity: Not on file  Stress: Not on file  Social Connections: Not on file     Family History: The patient's family history includes Colon polyps in his mother; Diabetes in his mother; Hyperlipidemia in his brother and mother; Hypertension in his  brother and father. There is no history of Colon cancer, Esophageal cancer, or Stomach cancer. ROS:   Please see the history of present illness.    All other systems reviewed and are negative.  EKGs/Labs/Other Studies Reviewed:    The following studies were reviewed today:  EKG:  EKG ordered today and personally reviewed.  The ekg ordered today demonstrates atrial fibrillation right bundle branch block the rate is relatively slow his beta-blocker dose to once daily  Recent Labs: 12/28/2020: ALT 17; BUN 12; Creatinine, Ser 1.41; Hemoglobin 15.0; Platelets 199.0; Potassium 3.4; Sodium 138  Recent Lipid Panel    Component Value Date/Time   CHOL 107 07/15/2019 1149   TRIG 146 07/15/2019 1149   HDL 26 (L) 07/15/2019 1149   CHOLHDL 4.1 07/15/2019 1149   LDLCALC 52 07/15/2019 1149    Physical Exam:    VS:  BP 136/76   Pulse (!) 58   Ht 5\' 11"  (1.803 m)   Wt 227 lb 12.8 oz (103.3 kg)   SpO2 94%   BMI 31.77 kg/m     Wt Readings from Last 3 Encounters:  02/06/21 227 lb 12.8 oz (103.3 kg)  12/28/20 233 lb (105.7 kg)  10/19/20 233 lb 8 oz (105.9 kg)     GEN:  Well nourished, well developed in no acute distress HEENT: Normal NECK: No JVD; No carotid bruits LYMPHATICS: No lymphadenopathy CARDIAC: Irregular S1 variable  no murmurs, rubs, gallops RESPIRATORY:  Clear to auscultation without rales, wheezing or rhonchi  ABDOMEN: Soft, non-tender, non-distended MUSCULOSKELETAL:  No edema; No deformity  SKIN: Warm and dry NEUROLOGIC:  Alert and oriented x 3 PSYCHIATRIC:  Normal affect    Signed, Shirlee More, MD  02/06/2021 9:39 AM    [place]

## 2021-02-06 ENCOUNTER — Other Ambulatory Visit: Payer: Self-pay

## 2021-02-06 ENCOUNTER — Ambulatory Visit (INDEPENDENT_AMBULATORY_CARE_PROVIDER_SITE_OTHER): Payer: Medicare Other | Admitting: Cardiology

## 2021-02-06 ENCOUNTER — Encounter: Payer: Self-pay | Admitting: Cardiology

## 2021-02-06 VITALS — BP 136/76 | HR 58 | Ht 71.0 in | Wt 227.8 lb

## 2021-02-06 DIAGNOSIS — I25118 Atherosclerotic heart disease of native coronary artery with other forms of angina pectoris: Secondary | ICD-10-CM

## 2021-02-06 DIAGNOSIS — I4891 Unspecified atrial fibrillation: Secondary | ICD-10-CM

## 2021-02-06 DIAGNOSIS — I110 Hypertensive heart disease with heart failure: Secondary | ICD-10-CM | POA: Diagnosis not present

## 2021-02-06 DIAGNOSIS — I4821 Permanent atrial fibrillation: Secondary | ICD-10-CM

## 2021-02-06 DIAGNOSIS — N200 Calculus of kidney: Secondary | ICD-10-CM | POA: Insufficient documentation

## 2021-02-06 DIAGNOSIS — N183 Chronic kidney disease, stage 3 unspecified: Secondary | ICD-10-CM | POA: Diagnosis not present

## 2021-02-06 DIAGNOSIS — E785 Hyperlipidemia, unspecified: Secondary | ICD-10-CM | POA: Diagnosis not present

## 2021-02-06 DIAGNOSIS — Z7901 Long term (current) use of anticoagulants: Secondary | ICD-10-CM | POA: Diagnosis not present

## 2021-02-06 DIAGNOSIS — Z09 Encounter for follow-up examination after completed treatment for conditions other than malignant neoplasm: Secondary | ICD-10-CM | POA: Insufficient documentation

## 2021-02-06 DIAGNOSIS — N401 Enlarged prostate with lower urinary tract symptoms: Secondary | ICD-10-CM | POA: Insufficient documentation

## 2021-02-06 HISTORY — DX: Calculus of kidney: N20.0

## 2021-02-06 HISTORY — DX: Unspecified atrial fibrillation: I48.91

## 2021-02-06 MED ORDER — METOPROLOL TARTRATE 25 MG PO TABS: 25.0000 mg | ORAL_TABLET | Freq: Every day | ORAL | 3 refills | Status: DC

## 2021-02-06 NOTE — Addendum Note (Signed)
Addended by: Truddie Hidden on: 02/06/2021 09:49 AM   Modules accepted: Orders

## 2021-02-06 NOTE — Patient Instructions (Signed)
Medication Instructions:  Your physician has recommended you make the following change in your medication:   Decrease your metoprolol to once daily.  *If you need a refill on your cardiac medications before your next appointment, please call your pharmacy*   Lab Work: None ordered If you have labs (blood work) drawn today and your tests are completely normal, you will receive your results only by: [name] MyChart Message (if you have MyChart) OR . A paper copy in the mail If you have any lab test that is abnormal or we need to change your treatment, we will call you to review the results.   Testing/Procedures: None ordered   Follow-Up: At [facility], you and your health needs are our priority.  As part of our continuing mission to provide you with exceptional heart care, we have created designated Provider Care Teams.  These Care Teams include your primary Cardiologist (physician) and Advanced Practice Providers (APPs -  Physician Assistants and Nurse Practitioners) who all work together to provide you with the care you need, when you need it.  We recommend signing up for the patient portal called "MyChart".  Sign up information is provided on this After Visit Summary.  MyChart is used to connect with patients for Virtual Visits (Telemedicine).  Patients are able to view lab/test results, encounter notes, upcoming appointments, etc.  Non-urgent messages can be sent to your provider as well.   To learn more about what you can do with MyChart, go to [email or web address].    Your next appointment:   6 month(s)  The format for your next appointment:   In Person  Provider:   Shirlee More, MD   Other Instructions NA

## 2021-02-13 DIAGNOSIS — J301 Allergic rhinitis due to pollen: Secondary | ICD-10-CM | POA: Diagnosis not present

## 2021-02-13 DIAGNOSIS — J4520 Mild intermittent asthma, uncomplicated: Secondary | ICD-10-CM | POA: Diagnosis not present

## 2021-02-13 DIAGNOSIS — G4733 Obstructive sleep apnea (adult) (pediatric): Secondary | ICD-10-CM | POA: Diagnosis not present

## 2021-03-08 DIAGNOSIS — Z96651 Presence of right artificial knee joint: Secondary | ICD-10-CM

## 2021-03-08 DIAGNOSIS — Z471 Aftercare following joint replacement surgery: Secondary | ICD-10-CM | POA: Diagnosis not present

## 2021-03-08 HISTORY — DX: Presence of right artificial knee joint: Z96.651

## 2021-04-12 ENCOUNTER — Other Ambulatory Visit: Payer: Self-pay

## 2021-04-12 MED ORDER — POTASSIUM CHLORIDE CRYS ER 10 MEQ PO TBCR: 20.0000 meq | EXTENDED_RELEASE_TABLET | Freq: Every day | ORAL | 2 refills | Status: DC

## 2021-04-12 NOTE — Telephone Encounter (Signed)
Refill of Potassium Chloride 10 mEq sent to [facility].

## 2021-04-19 DIAGNOSIS — Z0000 Encounter for general adult medical examination without abnormal findings: Secondary | ICD-10-CM | POA: Diagnosis not present

## 2021-04-19 DIAGNOSIS — Z1331 Encounter for screening for depression: Secondary | ICD-10-CM | POA: Diagnosis not present

## 2021-04-19 DIAGNOSIS — E785 Hyperlipidemia, unspecified: Secondary | ICD-10-CM | POA: Diagnosis not present

## 2021-04-19 DIAGNOSIS — Z9181 History of falling: Secondary | ICD-10-CM | POA: Diagnosis not present

## 2021-04-23 DIAGNOSIS — I130 Hypertensive heart and chronic kidney disease with heart failure and stage 1 through stage 4 chronic kidney disease, or unspecified chronic kidney disease: Secondary | ICD-10-CM | POA: Diagnosis not present

## 2021-04-23 DIAGNOSIS — K219 Gastro-esophageal reflux disease without esophagitis: Secondary | ICD-10-CM | POA: Diagnosis not present

## 2021-04-23 DIAGNOSIS — N182 Chronic kidney disease, stage 2 (mild): Secondary | ICD-10-CM | POA: Diagnosis not present

## 2021-04-23 DIAGNOSIS — K5900 Constipation, unspecified: Secondary | ICD-10-CM | POA: Diagnosis not present

## 2021-04-23 DIAGNOSIS — E785 Hyperlipidemia, unspecified: Secondary | ICD-10-CM | POA: Diagnosis not present

## 2021-04-23 DIAGNOSIS — Z6831 Body mass index (BMI) 31.0-31.9, adult: Secondary | ICD-10-CM | POA: Diagnosis not present

## 2021-04-23 DIAGNOSIS — J449 Chronic obstructive pulmonary disease, unspecified: Secondary | ICD-10-CM | POA: Diagnosis not present

## 2021-04-23 DIAGNOSIS — D649 Anemia, unspecified: Secondary | ICD-10-CM | POA: Diagnosis not present

## 2021-04-23 DIAGNOSIS — Z139 Encounter for screening, unspecified: Secondary | ICD-10-CM | POA: Diagnosis not present

## 2021-04-23 DIAGNOSIS — I5032 Chronic diastolic (congestive) heart failure: Secondary | ICD-10-CM | POA: Diagnosis not present

## 2021-04-23 DIAGNOSIS — R7303 Prediabetes: Secondary | ICD-10-CM | POA: Diagnosis not present

## 2021-04-23 DIAGNOSIS — I480 Paroxysmal atrial fibrillation: Secondary | ICD-10-CM | POA: Diagnosis not present

## 2021-07-23 DIAGNOSIS — R351 Nocturia: Secondary | ICD-10-CM | POA: Diagnosis not present

## 2021-07-23 DIAGNOSIS — R3129 Other microscopic hematuria: Secondary | ICD-10-CM | POA: Diagnosis not present

## 2021-07-23 DIAGNOSIS — N401 Enlarged prostate with lower urinary tract symptoms: Secondary | ICD-10-CM | POA: Diagnosis not present

## 2021-08-13 NOTE — Progress Notes (Signed)
Cardiology Office Note:    Date:  08/14/2021   ID:  Jason Stark, DOB 03/31/1946, MRN [id number]  PCP:  Renaldo Reel, PA  Cardiologist:  Shirlee More, MD    Referring MD: Renaldo Reel, PA    ASSESSMENT:    1. Coronary artery disease of native artery of native heart with stable angina pectoris ([place])   2. Hypertensive heart disease with heart failure (HCC)   3. Stage 3 chronic kidney disease, unspecified whether stage 3a or 3b CKD ([place])   4. Permanent atrial fibrillation ([place])   5. Chronic anticoagulation   6. Hyperlipidemia, unspecified hyperlipidemia type    PLAN:    In order of problems listed above:  He has stable CAD following CABG no angina continue treatment including his anticoagulant beta-blocker and lipid-lowering treatment LDL at target.  I would not advise an ischemia evaluation at this time Well-controlled BP at target with current treatment including ACE inhibitor beta-blocker and loop diuretic. Stable CKD Rate is controlled continue his anticoagulant no adjustment for renal dysfunction Lipids at target continue combined low intensity statin and Zetia achieving LDL at target  Follow-up 9 months  Medication Adjustments/Labs and Tests Ordered: Current medicines are reviewed at length with the patient today.  Concerns regarding medicines are outlined above.  No orders of the defined types were placed in this encounter.  No orders of the defined types were placed in this encounter.   Chief Complaint  Patient presents with   Follow-up    History of Present Illness:    Jason Stark is a 75 y.o. male with a hx of CAD with CABG as well as PCI of the native left circumflex coronary artery 03/13/2016 with drug-eluting stent heart failure diastolic chronic atrial fibrillation dyslipidemia and chronic anticoagulation last seen 02/06/2021.  Other problems include multiple colon polyps with rectal bleeding removed at colonoscopy and mild aortic and mitral  regurgitation. Compliance with diet, lifestyle and medications: Yes  He is very challenged at home caring for his wife with dementia and his mother-in-law with multiple medical illnesses and dementia 74 years old.  Fortunately his daughter assist him and to date it has not exceeded his capabilities. He has no angina dyspnea edema palpitation or syncope he tolerates his anticoagulant has had no recurrent bleeding. I reviewed recent labs lipids are at target mild CKD. Past Medical History:  Diagnosis Date   Abnormal myocardial perfusion study 03/11/2016   Overview:  Extensive lateral ischemia , [name] 44% in distribution of SVG to [facility] and M   Abnormal stress test 03/21/2016   Blood glucose elevated 08/27/2017   CAD (coronary artery disease), native coronary artery 06/13/2015   Overview:  Cardiac cath 03/13/16: Diagnostic Summary Severe native multivessel disease as described below Patent LIMA to LAD Patent SVG to PDA Occluded SVG to OM/OM2 ( jump graft) Occluded SVG to Diagonal Normal LV function Diagnostic Recommendations PCI to the Proximal LCX Medical therapy for the other disease Interventional Summary Successful PCI to the proximaL LCX with Xience 3.0 x 28 post dilated with 3.5 x 15 Ladd euphora   Cardiomegaly 08/27/2017   Cataract    Chronic anticoagulation 12/19/2015   Overview:  Edoxaban started 12/14/15   Chronic diastolic heart failure ([place]) 06/13/2015   Dyslipidemia 06/13/2015   Esophageal reflux 08/27/2017   [name] syndrome 08/27/2017   High risk medication use 06/13/2015   Overview:  Sotolol   History of colon polyps    Hyperlipidemia 08/27/2017   Hypertension    Hypertensive  heart disease with heart failure ([place]) 06/13/2015   OSA (obstructive sleep apnea) 08/27/2017   Osteoarthritis 08/27/2017   Persistent atrial fibrillation ([place]) 06/13/2015   Rheumatic disease of mitral valve 08/27/2017   Rheumatic heart failure (congestive) ([place]) 08/27/2017   SBO (small bowel obstruction) (HCC)    partial s/p  x-lap due to adhesions (Dr Sherald Hess) followed by incisional hernia s/p repair.   Sleep apnea     Past Surgical History:  Procedure Laterality Date   A FLUTTER ABLATION     a tach/flutter   CARDIAC SURGERY  2012   CARDIOVERSION  01/19/2016   CATARACT EXTRACTION     CHOLECYSTECTOMY     COLONOSCOPY W/ POLYPECTOMY  05/28/2017   Colonic polyp status post polypectomy. Moderate predominantly sigmoid diverticulosis. Small internal hemorrhoids (likely etiology of heme-positive stools- rule out other causes)   CORONARY ANGIOPLASTY WITH STENT PLACEMENT     CORONARY ARTERY BYPASS GRAFT  2011   with Maze and LAA ligation    CORONARY STENT PLACEMENT  03/2016   ESOPHAGOGASTRODUODENOSCOPY  07/23/2017   Mild gastritis. Small transient hiatal hernia. Otherwise normal EGD   HERNIA REPAIR     KNEE SURGERY     PROSTATE SURGERY     RECTAL SURGERY     TOTAL KNEE ARTHROPLASTY Right 02/2019    Current Medications: Current Meds  Medication Sig   acetaminophen (TYLENOL) 650 MG CR tablet Take 1,300 mg by mouth 2 (two) times daily as needed for pain.   amLODipine-benazepril (LOTREL) 5-20 MG capsule Take 1 capsule by mouth 2 (two) times daily.    Cholecalciferol (VITAMIN D-3) 125 MCG (5000 UT) TABS Take 1 tablet by mouth daily.   edoxaban (SAVAYSA) 60 MG TABS tablet Take 60 mg by mouth daily.   ezetimibe (ZETIA) 10 MG tablet Take 10 mg by mouth daily.   famotidine (PEPCID) 20 MG tablet Take 20 mg by mouth daily.   ferrous sulfate 325 (65 FE) MG tablet Take 324 mg by mouth daily.   fluticasone (FLONASE) 50 MCG/ACT nasal spray 1 spray by Each Nare route daily as needed for Rhinitis.   furosemide (LASIX) 20 MG tablet Take 20 mg by mouth daily.   loratadine (CLARITIN) 10 MG tablet Take 10 mg by mouth daily.    metoprolol tartrate (LOPRESSOR) 25 MG tablet Take 1 tablet (25 mg total) by mouth daily in the afternoon.   omeprazole (PRILOSEC) 40 MG capsule Take 40 mg by mouth daily.   polyethylene glycol  powder (GLYCOLAX/MIRALAX) 17 GM/SCOOP powder Take 0.5 Containers by mouth daily.   potassium chloride (KLOR-CON) 10 MEQ tablet Take 2 tablets (20 mEq total) by mouth daily.   pravastatin (PRAVACHOL) 40 MG tablet Take 1 tablet (40 mg total) by mouth daily.   tamsulosin (FLOMAX) 0.4 MG CAPS capsule Take 0.4 mg by mouth daily.     Allergies:   Patient has no known allergies.   Social History   Socioeconomic History   Marital status: Married    Spouse name: Not on file   Number of children: Not on file   Years of education: Not on file   Highest education level: Not on file  Occupational History   Not on file  Tobacco Use   Smoking status: Former    Packs/day: 0.25    Years: 25.00    Pack years: 6.25    Types: Cigarettes    Quit date: 2010    Years since quitting: 12.6   Smokeless tobacco: Former    Types:  Sarina Ser    Quit date: 2011  Vaping Use   Vaping Use: Never used  Substance and Sexual Activity   Alcohol use: No   Drug use: No   Sexual activity: Not on file  Other Topics Concern   Not on file  Social History Narrative   Not on file   Social Determinants of Health   Financial Resource Strain: Not on file  Food Insecurity: Not on file  Transportation Needs: Not on file  Physical Activity: Not on file  Stress: Not on file  Social Connections: Not on file     Family History: The patient's family history includes Colon polyps in his mother; Diabetes in his mother; Hyperlipidemia in his brother and mother; Hypertension in his brother and father. There is no history of Colon cancer, Esophageal cancer, or Stomach cancer. ROS:   Please see the history of present illness.    All other systems reviewed and are negative.  EKGs/Labs/Other Studies Reviewed:    The following studies were reviewed today:  EKG:  EKG performed last visit 02/06/2021 shows bifascicular heart block atrial fibrillation controlled ventricular rate Recent Labs:  Most recently performed  04/23/2021 Cholesterol 106 LDL 54 triglycerides 125 HDL 29 A1c 6.0% hemoglobin 15.8 creatinine mildly elevated 1.23 potassium  12/28/2020: ALT 17; BUN 12; Creatinine, Ser 1.41; Hemoglobin 15.0; Platelets 199.0; Potassium 3.4; Sodium 138  Recent Lipid Panel    Component Value Date/Time   CHOL 107 07/15/2019 1149   TRIG 146 07/15/2019 1149   HDL 26 (L) 07/15/2019 1149   CHOLHDL 4.1 07/15/2019 1149   LDLCALC 52 07/15/2019 1149    Physical Exam:    VS:  BP 120/80 (BP Location: Right Arm, Patient Position: Sitting, Cuff Size: Normal)   Pulse 70   Ht '5\' 11"'$  (1.803 m)   Wt 226 lb (102.5 kg)   SpO2 92%   BMI 31.52 kg/m     Wt Readings from Last 3 Encounters:  08/14/21 226 lb (102.5 kg)  02/06/21 227 lb 12.8 oz (103.3 kg)  12/28/20 233 lb (105.7 kg)     GEN:  Well nourished, well developed in no acute distress HEENT: Normal NECK: No JVD; No carotid bruits LYMPHATICS: No lymphadenopathy CARDIAC: Irregular rate and rhythm RRR, no murmurs, rubs, gallops RESPIRATORY:  Clear to auscultation without rales, wheezing or rhonchi  ABDOMEN: Soft, non-tender, non-distended MUSCULOSKELETAL:  No edema; No deformity  SKIN: Warm and dry NEUROLOGIC:  Alert and oriented x 3 PSYCHIATRIC:  Normal affect    Signed, Shirlee More, MD  08/14/2021 12:52 PM    [facility]

## 2021-08-14 ENCOUNTER — Ambulatory Visit (INDEPENDENT_AMBULATORY_CARE_PROVIDER_SITE_OTHER): Payer: Medicare Other | Admitting: Cardiology

## 2021-08-14 ENCOUNTER — Encounter: Payer: Self-pay | Admitting: Cardiology

## 2021-08-14 ENCOUNTER — Other Ambulatory Visit: Payer: Self-pay

## 2021-08-14 VITALS — BP 120/80 | HR 70 | Ht 71.0 in | Wt 226.0 lb

## 2021-08-14 DIAGNOSIS — I110 Hypertensive heart disease with heart failure: Secondary | ICD-10-CM | POA: Diagnosis not present

## 2021-08-14 DIAGNOSIS — Z7901 Long term (current) use of anticoagulants: Secondary | ICD-10-CM | POA: Diagnosis not present

## 2021-08-14 DIAGNOSIS — I4821 Permanent atrial fibrillation: Secondary | ICD-10-CM | POA: Diagnosis not present

## 2021-08-14 DIAGNOSIS — I25118 Atherosclerotic heart disease of native coronary artery with other forms of angina pectoris: Secondary | ICD-10-CM | POA: Diagnosis not present

## 2021-08-14 DIAGNOSIS — G4733 Obstructive sleep apnea (adult) (pediatric): Secondary | ICD-10-CM | POA: Diagnosis not present

## 2021-08-14 DIAGNOSIS — E785 Hyperlipidemia, unspecified: Secondary | ICD-10-CM

## 2021-08-14 DIAGNOSIS — J301 Allergic rhinitis due to pollen: Secondary | ICD-10-CM | POA: Diagnosis not present

## 2021-08-14 DIAGNOSIS — J4520 Mild intermittent asthma, uncomplicated: Secondary | ICD-10-CM | POA: Diagnosis not present

## 2021-08-14 DIAGNOSIS — N183 Chronic kidney disease, stage 3 unspecified: Secondary | ICD-10-CM

## 2021-08-14 MED ORDER — EDOXABAN TOSYLATE 60 MG PO TABS: 60.0000 mg | ORAL_TABLET | Freq: Every day | ORAL | 3 refills | Status: DC

## 2021-08-14 NOTE — Patient Instructions (Signed)

## 2021-08-30 DIAGNOSIS — I5032 Chronic diastolic (congestive) heart failure: Secondary | ICD-10-CM | POA: Diagnosis not present

## 2021-08-30 DIAGNOSIS — E785 Hyperlipidemia, unspecified: Secondary | ICD-10-CM | POA: Diagnosis not present

## 2021-08-30 DIAGNOSIS — K219 Gastro-esophageal reflux disease without esophagitis: Secondary | ICD-10-CM | POA: Diagnosis not present

## 2021-08-30 DIAGNOSIS — K5900 Constipation, unspecified: Secondary | ICD-10-CM | POA: Diagnosis not present

## 2021-08-30 DIAGNOSIS — N182 Chronic kidney disease, stage 2 (mild): Secondary | ICD-10-CM | POA: Diagnosis not present

## 2021-08-30 DIAGNOSIS — I700 Atherosclerosis of aorta: Secondary | ICD-10-CM | POA: Diagnosis not present

## 2021-08-30 DIAGNOSIS — Z6831 Body mass index (BMI) 31.0-31.9, adult: Secondary | ICD-10-CM | POA: Diagnosis not present

## 2021-08-30 DIAGNOSIS — J449 Chronic obstructive pulmonary disease, unspecified: Secondary | ICD-10-CM | POA: Diagnosis not present

## 2021-08-30 DIAGNOSIS — R7303 Prediabetes: Secondary | ICD-10-CM | POA: Diagnosis not present

## 2021-08-30 DIAGNOSIS — I130 Hypertensive heart and chronic kidney disease with heart failure and stage 1 through stage 4 chronic kidney disease, or unspecified chronic kidney disease: Secondary | ICD-10-CM | POA: Diagnosis not present

## 2021-08-30 DIAGNOSIS — D649 Anemia, unspecified: Secondary | ICD-10-CM | POA: Diagnosis not present

## 2021-08-30 DIAGNOSIS — I480 Paroxysmal atrial fibrillation: Secondary | ICD-10-CM | POA: Diagnosis not present

## 2021-09-03 DIAGNOSIS — R3129 Other microscopic hematuria: Secondary | ICD-10-CM | POA: Diagnosis not present

## 2021-09-03 DIAGNOSIS — R351 Nocturia: Secondary | ICD-10-CM | POA: Diagnosis not present

## 2021-09-03 DIAGNOSIS — N401 Enlarged prostate with lower urinary tract symptoms: Secondary | ICD-10-CM | POA: Diagnosis not present

## 2021-09-14 DIAGNOSIS — H9203 Otalgia, bilateral: Secondary | ICD-10-CM | POA: Diagnosis not present

## 2021-09-14 DIAGNOSIS — H6122 Impacted cerumen, left ear: Secondary | ICD-10-CM | POA: Diagnosis not present

## 2021-11-12 DIAGNOSIS — J4520 Mild intermittent asthma, uncomplicated: Secondary | ICD-10-CM | POA: Diagnosis not present

## 2021-11-12 DIAGNOSIS — G4733 Obstructive sleep apnea (adult) (pediatric): Secondary | ICD-10-CM | POA: Diagnosis not present

## 2021-11-12 DIAGNOSIS — J301 Allergic rhinitis due to pollen: Secondary | ICD-10-CM | POA: Diagnosis not present

## 2021-12-24 ENCOUNTER — Other Ambulatory Visit: Payer: Self-pay | Admitting: Cardiology

## 2022-01-02 DIAGNOSIS — E785 Hyperlipidemia, unspecified: Secondary | ICD-10-CM | POA: Diagnosis not present

## 2022-01-02 DIAGNOSIS — I130 Hypertensive heart and chronic kidney disease with heart failure and stage 1 through stage 4 chronic kidney disease, or unspecified chronic kidney disease: Secondary | ICD-10-CM | POA: Diagnosis not present

## 2022-01-02 DIAGNOSIS — D649 Anemia, unspecified: Secondary | ICD-10-CM | POA: Diagnosis not present

## 2022-01-02 DIAGNOSIS — I480 Paroxysmal atrial fibrillation: Secondary | ICD-10-CM | POA: Diagnosis not present

## 2022-01-02 DIAGNOSIS — J449 Chronic obstructive pulmonary disease, unspecified: Secondary | ICD-10-CM | POA: Diagnosis not present

## 2022-01-02 DIAGNOSIS — K5900 Constipation, unspecified: Secondary | ICD-10-CM | POA: Diagnosis not present

## 2022-01-02 DIAGNOSIS — I700 Atherosclerosis of aorta: Secondary | ICD-10-CM | POA: Diagnosis not present

## 2022-01-02 DIAGNOSIS — Z23 Encounter for immunization: Secondary | ICD-10-CM | POA: Diagnosis not present

## 2022-01-02 DIAGNOSIS — N182 Chronic kidney disease, stage 2 (mild): Secondary | ICD-10-CM | POA: Diagnosis not present

## 2022-01-02 DIAGNOSIS — K219 Gastro-esophageal reflux disease without esophagitis: Secondary | ICD-10-CM | POA: Diagnosis not present

## 2022-01-02 DIAGNOSIS — R7303 Prediabetes: Secondary | ICD-10-CM | POA: Diagnosis not present

## 2022-01-02 DIAGNOSIS — I5032 Chronic diastolic (congestive) heart failure: Secondary | ICD-10-CM | POA: Diagnosis not present

## 2022-01-09 DIAGNOSIS — G4733 Obstructive sleep apnea (adult) (pediatric): Secondary | ICD-10-CM | POA: Diagnosis not present

## 2022-01-09 DIAGNOSIS — J4520 Mild intermittent asthma, uncomplicated: Secondary | ICD-10-CM | POA: Diagnosis not present

## 2022-01-09 DIAGNOSIS — J301 Allergic rhinitis due to pollen: Secondary | ICD-10-CM | POA: Diagnosis not present

## 2022-01-14 ENCOUNTER — Other Ambulatory Visit: Payer: Self-pay | Admitting: Cardiology

## 2022-02-12 ENCOUNTER — Other Ambulatory Visit: Payer: Self-pay | Admitting: Cardiology

## 2022-02-27 DIAGNOSIS — L299 Pruritus, unspecified: Secondary | ICD-10-CM | POA: Diagnosis not present

## 2022-03-05 DIAGNOSIS — R351 Nocturia: Secondary | ICD-10-CM | POA: Diagnosis not present

## 2022-03-05 DIAGNOSIS — N401 Enlarged prostate with lower urinary tract symptoms: Secondary | ICD-10-CM | POA: Diagnosis not present

## 2022-03-05 DIAGNOSIS — Z125 Encounter for screening for malignant neoplasm of prostate: Secondary | ICD-10-CM | POA: Diagnosis not present

## 2022-04-09 DIAGNOSIS — J301 Allergic rhinitis due to pollen: Secondary | ICD-10-CM | POA: Diagnosis not present

## 2022-04-09 DIAGNOSIS — J4520 Mild intermittent asthma, uncomplicated: Secondary | ICD-10-CM | POA: Diagnosis not present

## 2022-04-09 DIAGNOSIS — G4733 Obstructive sleep apnea (adult) (pediatric): Secondary | ICD-10-CM | POA: Diagnosis not present

## 2022-05-02 DIAGNOSIS — I480 Paroxysmal atrial fibrillation: Secondary | ICD-10-CM | POA: Diagnosis not present

## 2022-05-02 DIAGNOSIS — R7303 Prediabetes: Secondary | ICD-10-CM | POA: Diagnosis not present

## 2022-05-02 DIAGNOSIS — K5904 Chronic idiopathic constipation: Secondary | ICD-10-CM | POA: Diagnosis not present

## 2022-05-02 DIAGNOSIS — N182 Chronic kidney disease, stage 2 (mild): Secondary | ICD-10-CM | POA: Diagnosis not present

## 2022-05-02 DIAGNOSIS — I700 Atherosclerosis of aorta: Secondary | ICD-10-CM | POA: Diagnosis not present

## 2022-05-02 DIAGNOSIS — D649 Anemia, unspecified: Secondary | ICD-10-CM | POA: Diagnosis not present

## 2022-05-02 DIAGNOSIS — I5032 Chronic diastolic (congestive) heart failure: Secondary | ICD-10-CM | POA: Diagnosis not present

## 2022-05-02 DIAGNOSIS — Z125 Encounter for screening for malignant neoplasm of prostate: Secondary | ICD-10-CM | POA: Diagnosis not present

## 2022-05-02 DIAGNOSIS — I130 Hypertensive heart and chronic kidney disease with heart failure and stage 1 through stage 4 chronic kidney disease, or unspecified chronic kidney disease: Secondary | ICD-10-CM | POA: Diagnosis not present

## 2022-05-02 DIAGNOSIS — K219 Gastro-esophageal reflux disease without esophagitis: Secondary | ICD-10-CM | POA: Diagnosis not present

## 2022-05-02 DIAGNOSIS — J449 Chronic obstructive pulmonary disease, unspecified: Secondary | ICD-10-CM | POA: Diagnosis not present

## 2022-05-02 DIAGNOSIS — E785 Hyperlipidemia, unspecified: Secondary | ICD-10-CM | POA: Diagnosis not present

## 2022-05-15 ENCOUNTER — Ambulatory Visit (INDEPENDENT_AMBULATORY_CARE_PROVIDER_SITE_OTHER): Payer: Medicare Other | Admitting: Cardiology

## 2022-05-15 VITALS — BP 142/80 | HR 67 | Ht 71.0 in | Wt 221.6 lb

## 2022-05-15 DIAGNOSIS — I110 Hypertensive heart disease with heart failure: Secondary | ICD-10-CM

## 2022-05-15 DIAGNOSIS — N183 Chronic kidney disease, stage 3 unspecified: Secondary | ICD-10-CM

## 2022-05-15 DIAGNOSIS — Z7901 Long term (current) use of anticoagulants: Secondary | ICD-10-CM

## 2022-05-15 DIAGNOSIS — E785 Hyperlipidemia, unspecified: Secondary | ICD-10-CM | POA: Diagnosis not present

## 2022-05-15 DIAGNOSIS — I25118 Atherosclerotic heart disease of native coronary artery with other forms of angina pectoris: Secondary | ICD-10-CM

## 2022-05-15 DIAGNOSIS — I4821 Permanent atrial fibrillation: Secondary | ICD-10-CM | POA: Diagnosis not present

## 2022-05-15 NOTE — Patient Instructions (Signed)
Medication Instructions:  Your physician recommends that you continue on your current medications as directed. Please refer to the Current Medication list given to you today.  *If you need a refill on your cardiac medications before your next appointment, please call your pharmacy*   Lab Work: NONE If you have labs (blood work) drawn today and your tests are completely normal, you will receive your results only by: MyChart Message (if you have MyChart) OR A paper copy in the mail If you have any lab test that is abnormal or we need to change your treatment, we will call you to review the results.   Testing/Procedures: NONE   Follow-Up: At [facility], you and your health needs are our priority.  As part of our continuing mission to provide you with exceptional heart care, we have created designated Provider Care Teams.  These Care Teams include your primary Cardiologist (physician) and Advanced Practice Providers (APPs -  Physician Assistants and Nurse Practitioners) who all work together to provide you with the care you need, when you need it.  We recommend signing up for the patient portal called "MyChart".  Sign up information is provided on this After Visit Summary.  MyChart is used to connect with patients for Virtual Visits (Telemedicine).  Patients are able to view lab/test results, encounter notes, upcoming appointments, etc.  Non-urgent messages can be sent to your provider as well.   To learn more about what you can do with MyChart, go to [email or web address].    Your next appointment:   9 month(s)  The format for your next appointment:   In Person  Provider:   Brian Munley, MD    Other Instructions   Important Information About Sugar       

## 2022-05-15 NOTE — Progress Notes (Signed)
Cardiology Office Note:    Date:  05/15/2022   ID:  Jason Stark, DOB 10-02-1946, MRN [id number]  PCP:  Renaldo Reel, PA  Cardiologist:  Shirlee More, MD    Referring MD: Renaldo Reel, PA    ASSESSMENT:    1. Permanent atrial fibrillation ([place])   2. Chronic anticoagulation   3. Hypertensive heart disease with heart failure (HCC)   4. Stage 3 chronic kidney disease, unspecified whether stage 3a or 3b CKD ([place])   5. Coronary artery disease of native artery of native heart with stable angina pectoris ([place])   6. Hyperlipidemia, unspecified hyperlipidemia type    PLAN:    In order of problems listed above:  Despite the intense demands of caring for his wife at home Jason Stark continues to do well his atrial fibrillation is rate controlled on low-dose beta-blocker and he is anticoagulated without bleeding complication continue both Stable heart failure is compensated no fluid overload continue his current loop diuretic along with his combination ACE inhibitor calcium channel blocker Stable CAD no angina on current medical therapy continue treatment including intensity statin lipids at target   Next appointment: 9 months   Medication Adjustments/Labs and Tests Ordered: Current medicines are reviewed at length with the patient today.  Concerns regarding medicines are outlined above.  No orders of the defined types were placed in this encounter.  No orders of the defined types were placed in this encounter.      History of Present Illness:    Jason Stark is a 76 y.o. male with a hx of CAD with CABG as well as PCI of the native left circumflex coronary artery 03/13/2016 with drug-eluting stent heart failure diastolic chronic atrial fibrillation dyslipidemia and chronic anticoagulation last seen 02/06/2021.  Other problems include multiple colon polyps with rectal bleeding removed at colonoscopy and mild aortic and mitral regurgitation. He was last seen  08/14/2022.  Compliance with diet, lifestyle and medications: Yes  He struggles to take care of his wife with increasing demands with her dementia and his mother-in-law recently died and need care for her also He thinks this has had a negative impact on his quality of life and his health He has had no edema shortness of breath angina palpitation or syncope He has had no bleeding from his anticoagulant Said no muscle pain or weakness.  Recent labs with his PCP 05/02/2022 shows lipids at target LDL 70 cholesterol 118 A1c 6.0 hemoglobin 15.6 creatinine 1.2 Past Medical History:  Diagnosis Date   Abnormal myocardial perfusion study 03/11/2016   Overview:  Extensive lateral ischemia , [name] 44% in distribution of SVG to [facility] and M   Abnormal stress test 03/21/2016   Blood glucose elevated 08/27/2017   Cardiomegaly 08/27/2017   Cataract    Chronic diastolic heart failure ([place]) 06/13/2015   Dyslipidemia 06/13/2015   Esophageal reflux 08/27/2017   [name] syndrome 08/27/2017   High risk medication use 06/13/2015   Overview:  Sotolol   History of colon polyps    Hypertensive heart disease with heart failure ([place]) 06/13/2015   OSA (obstructive sleep apnea) 08/27/2017   Osteoarthritis 08/27/2017   Persistent atrial fibrillation ([place]) 06/13/2015   Rheumatic disease of mitral valve 08/27/2017   Rheumatic heart failure (congestive) ([place]) 08/27/2017   SBO (small bowel obstruction) (HCC)    partial s/p x-lap due to adhesions (Dr Sherald Hess) followed by incisional hernia s/p repair.   Sleep apnea     Past Surgical History:  Procedure Laterality Date  A FLUTTER ABLATION     a tach/flutter   CARDIAC SURGERY  2012   CARDIOVERSION  01/19/2016   CATARACT EXTRACTION     CHOLECYSTECTOMY     COLONOSCOPY W/ POLYPECTOMY  05/28/2017   Colonic polyp status post polypectomy. Moderate predominantly sigmoid diverticulosis. Small internal hemorrhoids (likely etiology of heme-positive stools- rule out  other causes)   CORONARY ANGIOPLASTY WITH STENT PLACEMENT     CORONARY ARTERY BYPASS GRAFT  2011   with Maze and LAA ligation    CORONARY STENT PLACEMENT  03/2016   ESOPHAGOGASTRODUODENOSCOPY  07/23/2017   Mild gastritis. Small transient hiatal hernia. Otherwise normal EGD   HERNIA REPAIR     KNEE SURGERY     PROSTATE SURGERY     RECTAL SURGERY     TOTAL KNEE ARTHROPLASTY Right 02/2019    Current Medications: Current Meds  Medication Sig   acetaminophen (TYLENOL) 650 MG CR tablet Take 1,300 mg by mouth 2 (two) times daily as needed for pain.   amLODipine-benazepril (LOTREL) 5-20 MG capsule Take 1 capsule by mouth 2 (two) times daily.    Cholecalciferol (VITAMIN D-3) 125 MCG (5000 UT) TABS Take 1 tablet by mouth daily.   Docusate Calcium (STOOL SOFTENER PO) Take 1 capsule by mouth daily as needed (constipation).   edoxaban (SAVAYSA) 60 MG TABS tablet Take 60 mg by mouth daily.   ezetimibe (ZETIA) 10 MG tablet Take 10 mg by mouth daily.   famotidine (PEPCID) 20 MG tablet Take 20 mg by mouth daily.   ferrous sulfate 325 (65 FE) MG tablet Take 324 mg by mouth daily.   Fiber POWD Take by mouth. Takes 2 tsp daily   fluticasone (FLONASE) 50 MCG/ACT nasal spray 1 spray by Each Nare route daily as needed for Rhinitis.   loratadine (CLARITIN) 10 MG tablet Take 10 mg by mouth daily.    metoprolol tartrate (LOPRESSOR) 25 MG tablet Take 1 tablet (25 mg total) by mouth daily in the afternoon.   potassium chloride (KLOR-CON) 10 MEQ tablet Take 2 tablets (20 mEq total) by mouth daily.   pravastatin (PRAVACHOL) 40 MG tablet Take 1 tablet (40 mg total) by mouth daily.   tamsulosin (FLOMAX) 0.4 MG CAPS capsule Take 0.4 mg by mouth daily.   torsemide (DEMADEX) 20 MG tablet Take 20 mg by mouth daily.     Allergies:   Patient has no known allergies.   Social History   Socioeconomic History   Marital status: Married    Spouse name: Not on file   Number of children: Not on file   Years of  education: Not on file   Highest education level: Not on file  Occupational History   Not on file  Tobacco Use   Smoking status: Former    Packs/day: 0.25    Years: 25.00    Pack years: 6.25    Types: Cigarettes    Quit date: 2010    Years since quitting: 13.4    Passive exposure: Past   Smokeless tobacco: Former    Types: Chew    Quit date: 2011  Vaping Use   Vaping Use: Never used  Substance and Sexual Activity   Alcohol use: No   Drug use: No   Sexual activity: Not on file  Other Topics Concern   Not on file  Social History Narrative   Not on file   Social Determinants of Health   Financial Resource Strain: Not on file  Food Insecurity: Not on file  Transportation Needs: Not on file  Physical Activity: Not on file  Stress: Not on file  Social Connections: Not on file     Family History: The patient's family history includes Colon polyps in his mother; Diabetes in his mother; Hyperlipidemia in his brother and mother; Hypertension in his brother and father. There is no history of Colon cancer, Esophageal cancer, or Stomach cancer. ROS:   Please see the history of present illness.    All other systems reviewed and are negative.  EKGs/Labs/Other Studies Reviewed:    The following studies were reviewed today:  EKG:  EKG ordered today and personally reviewed.  The ekg ordered today demonstrates atrial fibrillation controlled ventricular rate right bundle branch block  Recent Labs: No results found for requested labs within last 8760 hours.  Recent Lipid Panel    Component Value Date/Time   CHOL 107 07/15/2019 1149   TRIG 146 07/15/2019 1149   HDL 26 (L) 07/15/2019 1149   CHOLHDL 4.1 07/15/2019 1149   LDLCALC 52 07/15/2019 1149    Physical Exam:    VS:  BP (!) 142/80 (BP Location: Left Arm, Patient Position: Sitting)   Pulse 67   Ht '5\' 11"'$  (1.803 m)   Wt 221 lb 9.6 oz (100.5 kg)   SpO2 98%   BMI 30.91 kg/m     Wt Readings from Last 3 Encounters:   05/15/22 221 lb 9.6 oz (100.5 kg)  08/14/21 226 lb (102.5 kg)  02/06/21 227 lb 12.8 oz (103.3 kg)     GEN:  Well nourished, well developed in no acute distress HEENT: Normal NECK: No JVD; No carotid bruits LYMPHATICS: No lymphadenopathy CARDIAC: Irregular rate and rhythm no murmurs, rubs, gallops RESPIRATORY:  Clear to auscultation without rales, wheezing or rhonchi  ABDOMEN: Soft, non-tender, non-distended MUSCULOSKELETAL:  No edema; No deformity  SKIN: Warm and dry NEUROLOGIC:  Alert and oriented x 3 PSYCHIATRIC:  Normal affect    Signed, Shirlee More, MD  05/15/2022 3:01 PM    [facility]

## 2022-06-24 ENCOUNTER — Other Ambulatory Visit: Payer: Self-pay | Admitting: Cardiology

## 2022-06-24 NOTE — Telephone Encounter (Signed)
Rx refill sent to pharmacy. 

## 2022-07-09 DIAGNOSIS — J301 Allergic rhinitis due to pollen: Secondary | ICD-10-CM | POA: Diagnosis not present

## 2022-07-09 DIAGNOSIS — J4520 Mild intermittent asthma, uncomplicated: Secondary | ICD-10-CM | POA: Diagnosis not present

## 2022-07-09 DIAGNOSIS — G4733 Obstructive sleep apnea (adult) (pediatric): Secondary | ICD-10-CM | POA: Diagnosis not present

## 2022-07-17 ENCOUNTER — Other Ambulatory Visit: Payer: Self-pay | Admitting: Cardiology

## 2022-07-18 ENCOUNTER — Other Ambulatory Visit: Payer: Self-pay

## 2022-07-18 MED ORDER — METOPROLOL TARTRATE 25 MG PO TABS: 25.0000 mg | ORAL_TABLET | Freq: Every day | ORAL | 2 refills | Status: DC

## 2022-07-29 ENCOUNTER — Telehealth: Payer: Self-pay | Admitting: Cardiology

## 2022-07-29 MED ORDER — METOPROLOL TARTRATE 25 MG PO TABS: 25.0000 mg | ORAL_TABLET | Freq: Every day | ORAL | 2 refills | Status: DC

## 2022-07-29 NOTE — Telephone Encounter (Signed)
*  STAT* If patient is at the pharmacy, call can be transferred to refill team.   1. Which medications need to be refilled? (please list name of each medication and dose if known) new prescription for Metoprolol  2. Which pharmacy/location (including street and city if local pharmacy) is medication to be sent to? [facility]  3. Do they need a 30 day or 90 day supply? 90 days and refills

## 2022-08-20 DIAGNOSIS — J301 Allergic rhinitis due to pollen: Secondary | ICD-10-CM | POA: Diagnosis not present

## 2022-08-20 DIAGNOSIS — G4733 Obstructive sleep apnea (adult) (pediatric): Secondary | ICD-10-CM | POA: Diagnosis not present

## 2022-08-20 DIAGNOSIS — J4520 Mild intermittent asthma, uncomplicated: Secondary | ICD-10-CM | POA: Diagnosis not present

## 2022-09-02 DIAGNOSIS — I130 Hypertensive heart and chronic kidney disease with heart failure and stage 1 through stage 4 chronic kidney disease, or unspecified chronic kidney disease: Secondary | ICD-10-CM | POA: Diagnosis not present

## 2022-09-02 DIAGNOSIS — K219 Gastro-esophageal reflux disease without esophagitis: Secondary | ICD-10-CM | POA: Diagnosis not present

## 2022-09-02 DIAGNOSIS — D649 Anemia, unspecified: Secondary | ICD-10-CM | POA: Diagnosis not present

## 2022-09-02 DIAGNOSIS — J449 Chronic obstructive pulmonary disease, unspecified: Secondary | ICD-10-CM | POA: Diagnosis not present

## 2022-09-02 DIAGNOSIS — N182 Chronic kidney disease, stage 2 (mild): Secondary | ICD-10-CM | POA: Diagnosis not present

## 2022-09-02 DIAGNOSIS — Z23 Encounter for immunization: Secondary | ICD-10-CM | POA: Diagnosis not present

## 2022-09-02 DIAGNOSIS — E785 Hyperlipidemia, unspecified: Secondary | ICD-10-CM | POA: Diagnosis not present

## 2022-09-02 DIAGNOSIS — I5032 Chronic diastolic (congestive) heart failure: Secondary | ICD-10-CM | POA: Diagnosis not present

## 2022-09-02 DIAGNOSIS — K5904 Chronic idiopathic constipation: Secondary | ICD-10-CM | POA: Diagnosis not present

## 2022-09-02 DIAGNOSIS — Z9181 History of falling: Secondary | ICD-10-CM | POA: Diagnosis not present

## 2022-09-02 DIAGNOSIS — R7303 Prediabetes: Secondary | ICD-10-CM | POA: Diagnosis not present

## 2022-09-02 DIAGNOSIS — I480 Paroxysmal atrial fibrillation: Secondary | ICD-10-CM | POA: Diagnosis not present

## 2022-09-09 DIAGNOSIS — N401 Enlarged prostate with lower urinary tract symptoms: Secondary | ICD-10-CM | POA: Diagnosis not present

## 2022-09-09 DIAGNOSIS — R351 Nocturia: Secondary | ICD-10-CM | POA: Diagnosis not present

## 2022-09-09 DIAGNOSIS — R3129 Other microscopic hematuria: Secondary | ICD-10-CM | POA: Diagnosis not present

## 2022-09-16 DIAGNOSIS — I672 Cerebral atherosclerosis: Secondary | ICD-10-CM | POA: Diagnosis not present

## 2022-09-16 DIAGNOSIS — N281 Cyst of kidney, acquired: Secondary | ICD-10-CM | POA: Diagnosis not present

## 2022-09-16 DIAGNOSIS — N323 Diverticulum of bladder: Secondary | ICD-10-CM | POA: Diagnosis not present

## 2022-09-16 DIAGNOSIS — K5730 Diverticulosis of large intestine without perforation or abscess without bleeding: Secondary | ICD-10-CM | POA: Diagnosis not present

## 2022-09-16 DIAGNOSIS — R319 Hematuria, unspecified: Secondary | ICD-10-CM | POA: Diagnosis not present

## 2022-10-03 DIAGNOSIS — R3129 Other microscopic hematuria: Secondary | ICD-10-CM | POA: Diagnosis not present

## 2022-10-03 DIAGNOSIS — N401 Enlarged prostate with lower urinary tract symptoms: Secondary | ICD-10-CM | POA: Diagnosis not present

## 2022-10-03 DIAGNOSIS — R351 Nocturia: Secondary | ICD-10-CM | POA: Diagnosis not present

## 2022-12-04 DIAGNOSIS — S39012A Strain of muscle, fascia and tendon of lower back, initial encounter: Secondary | ICD-10-CM | POA: Diagnosis not present

## 2022-12-04 DIAGNOSIS — J069 Acute upper respiratory infection, unspecified: Secondary | ICD-10-CM | POA: Diagnosis not present

## 2022-12-17 DIAGNOSIS — E785 Hyperlipidemia, unspecified: Secondary | ICD-10-CM | POA: Diagnosis not present

## 2022-12-17 DIAGNOSIS — D649 Anemia, unspecified: Secondary | ICD-10-CM | POA: Diagnosis not present

## 2022-12-17 DIAGNOSIS — R7303 Prediabetes: Secondary | ICD-10-CM | POA: Diagnosis not present

## 2022-12-17 DIAGNOSIS — J069 Acute upper respiratory infection, unspecified: Secondary | ICD-10-CM | POA: Diagnosis not present

## 2022-12-17 DIAGNOSIS — I480 Paroxysmal atrial fibrillation: Secondary | ICD-10-CM | POA: Diagnosis not present

## 2022-12-17 DIAGNOSIS — I130 Hypertensive heart and chronic kidney disease with heart failure and stage 1 through stage 4 chronic kidney disease, or unspecified chronic kidney disease: Secondary | ICD-10-CM | POA: Diagnosis not present

## 2022-12-17 DIAGNOSIS — N182 Chronic kidney disease, stage 2 (mild): Secondary | ICD-10-CM | POA: Diagnosis not present

## 2022-12-26 DIAGNOSIS — J069 Acute upper respiratory infection, unspecified: Secondary | ICD-10-CM | POA: Diagnosis not present

## 2022-12-26 DIAGNOSIS — R059 Cough, unspecified: Secondary | ICD-10-CM | POA: Diagnosis not present

## 2022-12-27 DIAGNOSIS — F17210 Nicotine dependence, cigarettes, uncomplicated: Secondary | ICD-10-CM | POA: Diagnosis not present

## 2022-12-27 DIAGNOSIS — J301 Allergic rhinitis due to pollen: Secondary | ICD-10-CM | POA: Diagnosis not present

## 2022-12-27 DIAGNOSIS — J069 Acute upper respiratory infection, unspecified: Secondary | ICD-10-CM | POA: Diagnosis not present

## 2022-12-27 DIAGNOSIS — G4733 Obstructive sleep apnea (adult) (pediatric): Secondary | ICD-10-CM | POA: Diagnosis not present

## 2022-12-27 DIAGNOSIS — J4520 Mild intermittent asthma, uncomplicated: Secondary | ICD-10-CM | POA: Diagnosis not present

## 2023-01-20 ENCOUNTER — Telehealth: Payer: Self-pay | Admitting: Cardiology

## 2023-01-20 DIAGNOSIS — R040 Epistaxis: Secondary | ICD-10-CM

## 2023-01-20 DIAGNOSIS — Z7901 Long term (current) use of anticoagulants: Secondary | ICD-10-CM

## 2023-01-20 NOTE — Telephone Encounter (Signed)
Referral made to [facility] ENT as Dr. Gaylyn Cheers is booked until April.

## 2023-01-20 NOTE — Telephone Encounter (Signed)
Pt c/o medication issue:  1. Name of Medication: edoxaban (SAVAYSA) 60 MG TABS tablet   2. How are you currently taking this medication (dosage and times per day)? As prescribed   3. Are you having a reaction (difficulty breathing--STAT)? Yes  4. What is your medication issue? Patient is calling to report he has had three nose bleeds in the past week. The last one being yesterday. He states they have been severe and lasted a long time. He is wanting to know if he should continue taking his blood thinner as prescribed due to this. Please advise.

## 2023-01-20 NOTE — Telephone Encounter (Signed)
Recommendations reviewed with pt as per Dr. Joya Gaskins note.  Pt verbalized understanding and had no additional questions. Routed to PCP

## 2023-01-20 NOTE — Addendum Note (Signed)
Addended by: Truddie Hidden on: 01/20/2023 03:29 PM   Modules accepted: Orders

## 2023-01-23 DIAGNOSIS — R040 Epistaxis: Secondary | ICD-10-CM | POA: Diagnosis not present

## 2023-01-23 DIAGNOSIS — R457 State of emotional shock and stress, unspecified: Secondary | ICD-10-CM | POA: Diagnosis not present

## 2023-02-03 DIAGNOSIS — N200 Calculus of kidney: Secondary | ICD-10-CM | POA: Diagnosis not present

## 2023-02-03 DIAGNOSIS — Z0389 Encounter for observation for other suspected diseases and conditions ruled out: Secondary | ICD-10-CM | POA: Diagnosis not present

## 2023-02-03 DIAGNOSIS — N390 Urinary tract infection, site not specified: Secondary | ICD-10-CM | POA: Diagnosis not present

## 2023-02-05 DIAGNOSIS — Z122 Encounter for screening for malignant neoplasm of respiratory organs: Secondary | ICD-10-CM | POA: Diagnosis not present

## 2023-02-05 DIAGNOSIS — Z87891 Personal history of nicotine dependence: Secondary | ICD-10-CM | POA: Diagnosis not present

## 2023-02-07 DIAGNOSIS — J301 Allergic rhinitis due to pollen: Secondary | ICD-10-CM | POA: Diagnosis not present

## 2023-02-07 DIAGNOSIS — G4733 Obstructive sleep apnea (adult) (pediatric): Secondary | ICD-10-CM | POA: Diagnosis not present

## 2023-02-07 DIAGNOSIS — J4520 Mild intermittent asthma, uncomplicated: Secondary | ICD-10-CM | POA: Diagnosis not present

## 2023-02-11 NOTE — Addendum Note (Signed)
Addended by: Truddie Hidden on: 02/11/2023 01:53 PM   Modules accepted: Orders

## 2023-03-04 ENCOUNTER — Telehealth: Payer: Self-pay | Admitting: Cardiology

## 2023-03-04 NOTE — Telephone Encounter (Signed)
   Primary Cardiologist: Shirlee More, MD  Chart reviewed as part of pre-operative protocol coverage. Simple dental extractions are considered low risk procedures per guidelines and generally do not require any specific cardiac clearance. It is also generally accepted that for simple extractions and dental cleanings, there is no need to interrupt blood thinner therapy.   SBE prophylaxis is not required for the patient.  I will route this recommendation to the requesting party via Epic fax function and remove from pre-op pool.  Please call with questions.  Emmaline Life, NP-C  03/04/2023, 3:13 PM [address], [address] Office [phone] Fax [phone]

## 2023-03-04 NOTE — Telephone Encounter (Signed)
   Pre-operative Risk Assessment    Patient Name: Louise Mccullagh  DOB: 10-23-1946 MRN: [id number]     Request for Surgical Clearance    Procedure:  Dental Extraction - Amount of Teeth to be Pulled:  1  Date of Surgery:  Clearance 03/04/23                                 Surgeon:  Dr. Vick Frees Surgeon's Group or Practice Name:  Dr. Francee Piccolo [facility]'s office  Phone number:  [phone] Fax number:  [phone]   Type of Clearance Requested:   - Pharmacy:  Hold        Type of Anesthesia:  Local    Additional requests/questions:  Please advise surgeon/provider what medications should be held.  Signed, April Henson   03/04/2023, 3:03 PM

## 2023-03-24 ENCOUNTER — Ambulatory Visit: Payer: Medicare Other | Admitting: Cardiology

## 2023-04-02 DIAGNOSIS — Z125 Encounter for screening for malignant neoplasm of prostate: Secondary | ICD-10-CM | POA: Diagnosis not present

## 2023-04-02 DIAGNOSIS — N401 Enlarged prostate with lower urinary tract symptoms: Secondary | ICD-10-CM | POA: Diagnosis not present

## 2023-04-02 DIAGNOSIS — R3129 Other microscopic hematuria: Secondary | ICD-10-CM | POA: Diagnosis not present

## 2023-04-02 DIAGNOSIS — R351 Nocturia: Secondary | ICD-10-CM | POA: Diagnosis not present

## 2023-04-14 ENCOUNTER — Other Ambulatory Visit: Payer: Self-pay | Admitting: Cardiology

## 2023-04-14 NOTE — Telephone Encounter (Signed)
Scr from [facility] 1.27

## 2023-04-17 DIAGNOSIS — I130 Hypertensive heart and chronic kidney disease with heart failure and stage 1 through stage 4 chronic kidney disease, or unspecified chronic kidney disease: Secondary | ICD-10-CM | POA: Diagnosis not present

## 2023-04-17 DIAGNOSIS — R7303 Prediabetes: Secondary | ICD-10-CM | POA: Diagnosis not present

## 2023-04-17 DIAGNOSIS — E785 Hyperlipidemia, unspecified: Secondary | ICD-10-CM | POA: Diagnosis not present

## 2023-04-17 DIAGNOSIS — Z139 Encounter for screening, unspecified: Secondary | ICD-10-CM | POA: Diagnosis not present

## 2023-04-17 DIAGNOSIS — R457 State of emotional shock and stress, unspecified: Secondary | ICD-10-CM | POA: Diagnosis not present

## 2023-04-17 DIAGNOSIS — N182 Chronic kidney disease, stage 2 (mild): Secondary | ICD-10-CM | POA: Diagnosis not present

## 2023-04-17 DIAGNOSIS — J449 Chronic obstructive pulmonary disease, unspecified: Secondary | ICD-10-CM | POA: Diagnosis not present

## 2023-04-17 DIAGNOSIS — D649 Anemia, unspecified: Secondary | ICD-10-CM | POA: Diagnosis not present

## 2023-04-17 DIAGNOSIS — I482 Chronic atrial fibrillation, unspecified: Secondary | ICD-10-CM | POA: Diagnosis not present

## 2023-04-18 DIAGNOSIS — J310 Chronic rhinitis: Secondary | ICD-10-CM | POA: Diagnosis not present

## 2023-04-18 DIAGNOSIS — J342 Deviated nasal septum: Secondary | ICD-10-CM | POA: Diagnosis not present

## 2023-04-25 ENCOUNTER — Other Ambulatory Visit: Payer: Self-pay | Admitting: Cardiology

## 2023-04-25 NOTE — Telephone Encounter (Signed)
Refill to pharmacy 

## 2023-05-12 ENCOUNTER — Other Ambulatory Visit: Payer: Self-pay

## 2023-05-15 NOTE — Progress Notes (Signed)
Cardiology Office Note:    Date:  05/16/2023   ID:  Jason Stark, DOB 1946/02/19, MRN [id number]  PCP:  Jason Corporal, PA  Cardiologist:  Jason Herrlich, MD    Referring MD: Jason Corporal, PA    ASSESSMENT:    1. Permanent atrial fibrillation (HCC)   2. Chronic anticoagulation   3. Hypertensive heart disease with heart failure (HCC)   4. Stage 3 chronic kidney disease, unspecified whether stage 3a or 3b CKD (HCC)   5. Coronary artery disease of native artery of native heart with stable angina pectoris (HCC)   6. Mixed hyperlipidemia    PLAN:    In order of problems listed above:  Overall Jason Stark is doing well he is asymptomatic with his atrial fibrillation rate controlled with metoprolol and tolerates his direct anticoagulant and has had no further epistasis following ENT evaluation and intervention continue his current anticoagulant Heart failure is nicely compensated I asked him to try to take his diuretic every day and continue his beta-blocker along with antihypertensive ACE inhibitor calcium channel blocker Stable CKD labs follow-up with his primary care physician Stable CAD he has no anginal discomfort on current medical therapy including lipid-lowering with combined pravastatin and Zetia and LDL at target.   Next appointment: 6 months   Medication Adjustments/Labs and Tests Ordered: Current medicines are reviewed at length with the patient today.  Concerns regarding medicines are outlined above.  No orders of the defined types were placed in this encounter.  No orders of the defined types were placed in this encounter.    History of Present Illness:    Jason Stark is a 77 y.o. male with a hx of permanent atrial fibrillation with chronic anticoagulation hypertensive heart disease with chronic diastolic heart failure and stage III CKD coronary artery disease with CABG as well as PCI of the native left circumflex coronary artery April 2017 mild mitral and  aortic regurgitation and hyper lipidemia last seen 05/15/2022.  Compliance with diet, lifestyle and medications: Yes  Occasionally misses his diuretic with medical interactions for both his wife and for him He is quite stressed and trouble caring for his wife with dementia at home Since he had ENT visit and intervention he has no further epistasis He is not having angina edema shortness of breath chest pain palpitation or syncope He tolerates lipid-lowering without muscle pain or weakness He is had no mucosal bleeding Cholesterol 0 04/17/1999 2406 LDL 59 triglyceride 87 A1c 6.1 hemoglobin 14.9 creatinine mildly elevated 1.21 potassium 4.7 Past Medical History:  Diagnosis Date   Abnormal myocardial perfusion study 03/11/2016   Overview:  Extensive lateral ischemia , [name] 44% in distribution of SVG to [facility] and M   Abnormal stress test 03/21/2016   Atrial fibrillation (HCC) 02/06/2021   Blood glucose elevated 08/27/2017   Cardiomegaly 08/27/2017   Cataract    Chronic diastolic heart failure (HCC) 06/13/2015   CKD (chronic kidney disease) stage 3, GFR 30-59 ml/min (HCC) 07/15/2019   Coronary atherosclerosis of native coronary artery 06/13/2015   Overview:   Cardiac cath 03/13/16: Diagnostic Summary  Severe native multivessel disease as described below  Patent LIMA to LAD  Patent SVG to PDA  Occluded SVG to OM/OM2 ( jump graft)  Occluded SVG to Diagonal  Normal LV function  Diagnostic Recommendations  PCI to the Proximal LCX  Medical therapy for the other disease  Interventional Summary  Successful PCI to the proximaL LCX with Xience 3.0 x   Dyslipidemia 06/13/2015  Esophageal reflux 08/27/2017   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   High risk medication use 06/13/2015   Overview:  Sotolol   History of colon polyps    Hypertension 12/09/1988   Hypertensive heart disease with heart failure (HCC) 06/13/2015   Kidney stone 02/06/2021   Long term current use of anticoagulant therapy 12/19/2015    Overview:   Edoxaban started 12/14/15   OSA (obstructive sleep apnea) 08/27/2017   Osteoarthritis 08/27/2017   Permanent atrial fibrillation (HCC) 06/13/2015   Persistent atrial fibrillation (HCC) 06/13/2015   Preoperative cardiovascular examination 12/30/2018   Rheumatic disease of mitral valve 08/27/2017   Rheumatic heart failure (congestive) (HCC) 08/27/2017   SBO (small bowel obstruction) (HCC)    partial s/p x-lap due to adhesions (Dr Jason Stark) followed by incisional hernia s/p repair.   Sleep apnea    Special screening for malignant neoplasms, colon 12/09/2008   Oct 24, 2009 Entered By: Jason Stark Comment: 2 polyps repeat 2013   Status post total right knee replacement 03/08/2021    Past Surgical History:  Procedure Laterality Date   A FLUTTER ABLATION     a tach/flutter   CARDIAC SURGERY  2012   CARDIOVERSION  01/19/2016   CATARACT EXTRACTION     CHOLECYSTECTOMY     COLONOSCOPY W/ POLYPECTOMY  05/28/2017   Colonic polyp status post polypectomy. Moderate predominantly sigmoid diverticulosis. Small internal hemorrhoids (likely etiology of heme-positive stools- rule out other causes)   CORONARY ANGIOPLASTY WITH STENT PLACEMENT     CORONARY ARTERY BYPASS GRAFT  2011   with Maze and LAA ligation    CORONARY STENT PLACEMENT  03/2016   ESOPHAGOGASTRODUODENOSCOPY  07/23/2017   Mild gastritis. Small transient hiatal hernia. Otherwise normal EGD   HERNIA REPAIR     KNEE SURGERY     PROSTATE SURGERY     RECTAL SURGERY     TOTAL KNEE ARTHROPLASTY Right 02/2019    Current Medications: Current Meds  Medication Sig   acetaminophen (TYLENOL) 650 MG CR tablet Take 1,300 mg by mouth 2 (two) times daily as needed for pain.   amLODipine-benazepril (LOTREL) 5-20 MG capsule Take 1 capsule by mouth 2 (two) times daily.    Cholecalciferol (VITAMIN D-3) 125 MCG (5000 UT) TABS Take 1 tablet by mouth daily.   Docusate Calcium (STOOL SOFTENER PO) Take 1 capsule by mouth daily as  needed (constipation).   edoxaban (SAVAYSA) 60 MG TABS tablet Take 60 mg by mouth daily.   ezetimibe (ZETIA) 10 MG tablet Take 10 mg by mouth daily.   ferrous sulfate 325 (65 FE) MG tablet Take 324 mg by mouth daily.   loratadine (CLARITIN) 10 MG tablet Take 10 mg by mouth daily.    metoprolol tartrate (LOPRESSOR) 25 MG tablet Take 25 mg by mouth daily.   potassium chloride (KLOR-CON) 10 MEQ tablet Take 20 mEq by mouth daily.   pravastatin (PRAVACHOL) 40 MG tablet Take 1 tablet (40 mg total) by mouth daily.   tamsulosin (FLOMAX) 0.4 MG CAPS capsule Take 0.4 mg by mouth daily.   torsemide (DEMADEX) 20 MG tablet Take 20 mg by mouth daily.     Allergies:   Patient has no known allergies.   Social History   Socioeconomic History   Marital status: Married    Spouse name: Not on file   Number of children: Not on file   Years of education: Not on file   Highest education level: Not on file  Occupational History   Not on file  Tobacco  Use   Smoking status: Former    Packs/day: 0.25    Years: 25.00    Additional pack years: 0.00    Total pack years: 6.25    Types: Cigarettes    Quit date: 2010    Years since quitting: 14.4    Passive exposure: Past   Smokeless tobacco: Former    Types: Chew    Quit date: 2011  Vaping Use   Vaping Use: Never used  Substance and Sexual Activity   Alcohol use: No   Drug use: No   Sexual activity: Not on file  Other Topics Concern   Not on file  Social History Narrative   Not on file   Social Determinants of Health   Financial Resource Strain: Not on file  Food Insecurity: Not on file  Transportation Needs: Not on file  Physical Activity: Not on file  Stress: Not on file  Social Connections: Not on file     Family History: The patient's family history includes Colon polyps in his mother; Diabetes in his mother; Hyperlipidemia in his brother and mother; Hypertension in his brother and father. There is no history of Colon cancer,  Esophageal cancer, or Stomach cancer. ROS:   Please see the history of present illness.    All other systems reviewed and are negative.  EKGs/Labs/Other Studies Reviewed:    The following studies were reviewed today:  Cardiac Studies & Procedures     STRESS TESTS  MYOCARDIAL PERFUSION IMAGING 10/22/2017  Narrative  This is an intermediate risk study.  There is no ischemia .  The study was not able to be gated due to the atrial fib .  The LV cavity is moderately dilated. Suggest Echo for assessment of LV function   ECHOCARDIOGRAM  ECHOCARDIOGRAM COMPLETE 01/01/2019  Narrative *[facility]* [address] [place], [place] [address] [phone]  ------------------------------------------------------------------- Transthoracic Echocardiography  Patient:    Lotus, Mcclenaghan MR #:       [id number] Study Date: 01/01/2019 Gender:     M Age:        35 Height:     180.3 cm Weight:     105.1 kg BSA:        2.33 m^2 Pt. Status: Room:  ATTENDING    Jason Herrlich, MD ORDERING     Jason Herrlich, MD REFERRING    Jason Herrlich, MD SONOGRAPHER  Lanae Crumbly, RDCS REFERRING    Jason Stark PERFORMING   [facility] [place]  cc:  ------------------------------------------------------------------- LV EF: 55% -   60%  ------------------------------------------------------------------- Indications:      Atrial fibrillation - 427.31.  Hypertension - benign with CHF 402.11.  ------------------------------------------------------------------- History:   PMH:   Coronary artery disease.  Risk factors: Dyslipidemia.  ------------------------------------------------------------------- Study Conclusions  - Left ventricle: The cavity size was normal. Wall thickness was increased in a pattern of moderate LVH. Systolic function was normal. The estimated ejection fraction was in the range of 55% to 60%. Wall motion was normal; there were no regional wall motion  abnormalities. - Aortic valve: There was mild regurgitation. - Mitral valve: There was mild regurgitation. - Left atrium: The atrium was moderately dilated.  Impressions:  - 1. Preserved systolic function. Moderate concentric LVH. 2. Mild AR, MR and TR. 3. Moderate LA enlargement. 4. Ascending aorta measured at 4.1 cm. 5. Mild IVC dilatation.  ------------------------------------------------------------------- Study data:  No prior study was available for comparison.  Study status:  Routine.  Procedure:  The patient reported  no pain pre or post test. Transthoracic echocardiography. Image quality was adequate.  Study completion:  There were no complications. Transthoracic echocardiography.  M-mode, complete 2D, spectral Doppler, and color Doppler.  Birthdate:  Patient birthdate: 18-Dec-1945.  Age:  Patient is 77 yr old.  Sex:  Gender: male. BMI: 32.3 kg/m^2.  Blood pressure:     122/80  Patient status: Outpatient.  Study date:  Study date: 01/01/2019. Study time: 09:43 AM.  Location:  Echo laboratory.  -------------------------------------------------------------------  ------------------------------------------------------------------- Left ventricle:  The cavity size was normal. Wall thickness was increased in a pattern of moderate LVH. Systolic function was normal. The estimated ejection fraction was in the range of 55% to 60%. Wall motion was normal; there were no regional wall motion abnormalities.  ------------------------------------------------------------------- Aortic valve:   Trileaflet; normal thickness leaflets. Mobility was not restricted.  Doppler:  Transvalvular velocity was within the normal range. There was no stenosis. There was mild regurgitation.  ------------------------------------------------------------------- Aorta:  Ascending aorta 4.1cm. Aortic root: The aortic root was normal in  size.  ------------------------------------------------------------------- Mitral valve:   Structurally normal valve.   Mobility was not restricted.  Doppler:  Transvalvular velocity was within the normal range. There was no evidence for stenosis. There was mild regurgitation.    Valve area by pressure half-time: 4 cm^2. Indexed valve area by pressure half-time: 1.72 cm^2/m^2.    Peak gradient (D): 3 mm Hg.  ------------------------------------------------------------------- Left atrium:  The atrium was moderately dilated.  ------------------------------------------------------------------- Right ventricle:  The cavity size was normal. Wall thickness was normal. Systolic function was normal.  ------------------------------------------------------------------- Pulmonic valve:    Doppler:  Transvalvular velocity was within the normal range. There was no evidence for stenosis.  ------------------------------------------------------------------- Tricuspid valve:   Structurally normal valve.    Doppler: Transvalvular velocity was within the normal range. There was mild regurgitation.  ------------------------------------------------------------------- Pulmonary artery:   The main pulmonary artery was normal-sized. Systolic pressure was within the normal range.  ------------------------------------------------------------------- Right atrium:  The atrium was normal in size.  ------------------------------------------------------------------- Pericardium:  There was no pericardial effusion.  ------------------------------------------------------------------- Systemic veins: Inferior vena cava: The vessel was normal in size.  ------------------------------------------------------------------- Post procedure conclusions Ascending Aorta:  - Ascending aorta 4.1cm.  ------------------------------------------------------------------- Measurements  Left ventricle                            Value          Reference LV ID, ED, PLAX chordal          (H)     56    mm       43 - 52 LV ID, ES, PLAX chordal          (H)     47    mm       23 - 38 LV fx shortening, PLAX chordal   (L)     16    %        >=29 LV PW thickness, ED                      18    mm       ---------- IVS/LV PW ratio, ED                      1              <=1.3 Stroke volume, 2D  77    ml       ---------- Stroke volume/bsa, 2D                    33    ml/m^2   ---------- LV e&', lateral                           6.74  cm/s     ---------- LV E/e&', lateral                         13.4           ---------- LV e&', medial                            5.11  cm/s     ---------- LV E/e&', medial                          17.67          ---------- LV e&', average                           5.93  cm/s     ---------- LV E/e&', average                         15.24          ----------  Ventricular septum                       Value          Reference IVS thickness, ED                        18    mm       ----------  LVOT                                     Value          Reference LVOT ID, S                               25    mm       ---------- LVOT area                                4.91  cm^2     ---------- LVOT peak velocity, S                    75.7  cm/s     ---------- LVOT mean velocity, S                    45.9  cm/s     ---------- LVOT VTI, S                              15.6  cm       ---------- LVOT peak gradient, S  2     mm Hg    ----------  Aortic valve                             Value          Reference Aortic regurg pressure half-time         542   ms       ----------  Aorta                                    Value          Reference Aortic root ID, ED                       43    mm       ---------- Ascending aorta ID, A-P, S               41    mm       ----------  Left atrium                              Value          Reference LA ID, A-P, ES                            52    mm       ---------- LA ID/bsa, A-P                   (H)     2.24  cm/m^2   <=2.2 LA volume, S                             116   ml       ---------- LA volume/bsa, S                         49.9  ml/m^2   ---------- LA volume, ES, 1-p A4C                   122   ml       ---------- LA volume/bsa, ES, 1-p A4C               52.5  ml/m^2   ---------- LA volume, ES, 1-p A2C                   110   ml       ---------- LA volume/bsa, ES, 1-p A2C               47.3  ml/m^2   ----------  Mitral valve                             Value          Reference Mitral E-wave peak velocity              90.3  cm/s     ---------- Mitral deceleration time                 187   ms  150 - 230 Mitral pressure half-time                55    ms       ---------- Mitral peak gradient, D                  3     mm Hg    ---------- Mitral valve area, PHT, DP               4     cm^2     ---------- Mitral valve area/bsa, PHT, DP           1.72  cm^2/m^2 ----------  Pulmonary arteries                       Value          Reference PA pressure, S, DP                       15    mm Hg    <=30  Tricuspid valve                          Value          Reference Tricuspid regurg peak velocity           132   cm/s     ---------- Tricuspid peak RV-RA gradient            7     mm Hg    ----------  Right atrium                             Value          Reference RA ID, S-I, ES, A4C              (H)     55    mm       34 - 49 RA area, ES, A4C                         14.8  cm^2     8.3 - 19.5 RA volume, ES, A/L                       32.6  ml       ---------- RA volume/bsa, ES, A/L                   14    ml/m^2   ----------  Systemic veins                           Value          Reference Estimated CVP                            8     mm Hg    ----------  Right ventricle                          Value          Reference TAPSE  14.1  mm       ---------- RV  pressure, S, DP                       15    mm Hg    <=30 RV s&', lateral, S                        7.62  cm/s     ----------  Legend: (L)  and  (H)  mark values outside specified reference range.  ------------------------------------------------------------------- Prepared and Electronically Authenticated by  Belva Crome, MD 2020-01-24T12:10:20             EKG:  EKG ordered today and personally reviewed.  The ekg ordered today demonstrates atrial fibrillation controlled ventricular rate right bundle branch block old inferior infarction unchanged from previous     Physical Exam:    VS:  BP 128/60   Pulse 73   Ht 5\' 11"  (1.803 m)   Wt 232 lb 9.6 oz (105.5 kg)   SpO2 95%   BMI 32.44 kg/m     Wt Readings from Last 3 Encounters:  05/16/23 232 lb 9.6 oz (105.5 kg)  05/15/22 221 lb 9.6 oz (100.5 kg)  08/14/21 226 lb (102.5 kg)     GEN:  Well nourished, well developed in no acute distress HEENT: Normal NECK: No JVD; No carotid bruits LYMPHATICS: No lymphadenopathy CARDIAC: Irregular rate and rhythm no murmurs, rubs, gallops RESPIRATORY:  Clear to auscultation without rales, wheezing or rhonchi  ABDOMEN: Soft, non-tender, non-distended MUSCULOSKELETAL:  No edema; No deformity  SKIN: Warm and dry NEUROLOGIC:  Alert and oriented x 3 PSYCHIATRIC:  Normal affect    Signed, Jason Herrlich, MD  05/16/2023 11:33 AM    [facility]

## 2023-05-16 ENCOUNTER — Encounter: Payer: Self-pay | Admitting: Cardiology

## 2023-05-16 ENCOUNTER — Ambulatory Visit: Payer: Medicare Other | Attending: Cardiology | Admitting: Cardiology

## 2023-05-16 VITALS — BP 128/60 | HR 73 | Ht 71.0 in | Wt 232.6 lb

## 2023-05-16 DIAGNOSIS — I110 Hypertensive heart disease with heart failure: Secondary | ICD-10-CM

## 2023-05-16 DIAGNOSIS — I25118 Atherosclerotic heart disease of native coronary artery with other forms of angina pectoris: Secondary | ICD-10-CM | POA: Diagnosis not present

## 2023-05-16 DIAGNOSIS — N183 Chronic kidney disease, stage 3 unspecified: Secondary | ICD-10-CM | POA: Diagnosis not present

## 2023-05-16 DIAGNOSIS — E782 Mixed hyperlipidemia: Secondary | ICD-10-CM

## 2023-05-16 DIAGNOSIS — Z7901 Long term (current) use of anticoagulants: Secondary | ICD-10-CM

## 2023-05-16 DIAGNOSIS — I4821 Permanent atrial fibrillation: Secondary | ICD-10-CM

## 2023-05-16 NOTE — Patient Instructions (Signed)
Medication Instructions:  Your physician recommends that you continue on your current medications as directed. Please refer to the Current Medication list given to you today.  *If you need a refill on your cardiac medications before your next appointment, please call your pharmacy*   Lab Work: None If you have labs (blood work) drawn today and your tests are completely normal, you will receive your results only by: MyChart Message (if you have MyChart) OR A paper copy in the mail If you have any lab test that is abnormal or we need to change your treatment, we will call you to review the results.   Testing/Procedures: None   Follow-Up: At [facility], you and your health needs are our priority.  As part of our continuing mission to provide you with exceptional heart care, we have created designated Provider Care Teams.  These Care Teams include your primary Cardiologist (physician) and Advanced Practice Providers (APPs -  Physician Assistants and Nurse Practitioners) who all work together to provide you with the care you need, when you need it.  We recommend signing up for the patient portal called "MyChart".  Sign up information is provided on this After Visit Summary.  MyChart is used to connect with patients for Virtual Visits (Telemedicine).  Patients are able to view lab/test results, encounter notes, upcoming appointments, etc.  Non-urgent messages can be sent to your provider as well.   To learn more about what you can do with MyChart, go to [email or web address].    Your next appointment:   6 month(s)  Provider:   Brian Munley, MD    Other Instructions None  

## 2023-05-26 ENCOUNTER — Encounter: Payer: Self-pay | Admitting: Urology

## 2023-06-19 DIAGNOSIS — H43392 Other vitreous opacities, left eye: Secondary | ICD-10-CM | POA: Diagnosis not present

## 2023-06-19 DIAGNOSIS — Z961 Presence of intraocular lens: Secondary | ICD-10-CM | POA: Diagnosis not present

## 2023-06-19 DIAGNOSIS — H26493 Other secondary cataract, bilateral: Secondary | ICD-10-CM | POA: Diagnosis not present

## 2023-06-19 DIAGNOSIS — H52223 Regular astigmatism, bilateral: Secondary | ICD-10-CM | POA: Diagnosis not present

## 2023-06-20 DIAGNOSIS — I482 Chronic atrial fibrillation, unspecified: Secondary | ICD-10-CM | POA: Diagnosis not present

## 2023-06-20 DIAGNOSIS — J069 Acute upper respiratory infection, unspecified: Secondary | ICD-10-CM | POA: Diagnosis not present

## 2023-06-23 DIAGNOSIS — J069 Acute upper respiratory infection, unspecified: Secondary | ICD-10-CM | POA: Diagnosis not present

## 2023-06-23 DIAGNOSIS — I482 Chronic atrial fibrillation, unspecified: Secondary | ICD-10-CM | POA: Diagnosis not present

## 2023-07-25 ENCOUNTER — Other Ambulatory Visit: Payer: Self-pay | Admitting: Cardiology

## 2023-07-28 DIAGNOSIS — J309 Allergic rhinitis, unspecified: Secondary | ICD-10-CM | POA: Diagnosis not present

## 2023-07-28 DIAGNOSIS — R059 Cough, unspecified: Secondary | ICD-10-CM | POA: Diagnosis not present

## 2023-08-05 DIAGNOSIS — J301 Allergic rhinitis due to pollen: Secondary | ICD-10-CM | POA: Diagnosis not present

## 2023-08-05 DIAGNOSIS — R918 Other nonspecific abnormal finding of lung field: Secondary | ICD-10-CM | POA: Diagnosis not present

## 2023-08-05 DIAGNOSIS — G4733 Obstructive sleep apnea (adult) (pediatric): Secondary | ICD-10-CM | POA: Diagnosis not present

## 2023-08-05 DIAGNOSIS — J4520 Mild intermittent asthma, uncomplicated: Secondary | ICD-10-CM | POA: Diagnosis not present

## 2023-09-02 DIAGNOSIS — G4733 Obstructive sleep apnea (adult) (pediatric): Secondary | ICD-10-CM | POA: Diagnosis not present

## 2023-09-02 DIAGNOSIS — R918 Other nonspecific abnormal finding of lung field: Secondary | ICD-10-CM | POA: Diagnosis not present

## 2023-09-02 DIAGNOSIS — J4520 Mild intermittent asthma, uncomplicated: Secondary | ICD-10-CM | POA: Diagnosis not present

## 2023-09-02 DIAGNOSIS — J301 Allergic rhinitis due to pollen: Secondary | ICD-10-CM | POA: Diagnosis not present

## 2023-09-03 ENCOUNTER — Ambulatory Visit (INDEPENDENT_AMBULATORY_CARE_PROVIDER_SITE_OTHER): Payer: Medicare Other | Admitting: Podiatry

## 2023-09-03 ENCOUNTER — Ambulatory Visit (INDEPENDENT_AMBULATORY_CARE_PROVIDER_SITE_OTHER): Payer: Medicare Other

## 2023-09-03 DIAGNOSIS — M778 Other enthesopathies, not elsewhere classified: Secondary | ICD-10-CM

## 2023-09-03 DIAGNOSIS — M722 Plantar fascial fibromatosis: Secondary | ICD-10-CM | POA: Diagnosis not present

## 2023-09-03 MED ORDER — TRIAMCINOLONE ACETONIDE 10 MG/ML IJ SUSP
10.0000 mg | Freq: Once | INTRAMUSCULAR | Status: AC
Start: 2023-09-03 — End: 2023-09-03
  Administered 2023-09-03: 10 mg via INTRAMUSCULAR

## 2023-09-03 NOTE — Progress Notes (Signed)
Chief Complaint  Patient presents with   Callouses    Callus trim bilateral feet.    Foot Pain    C/o heel pain bilateral but right foot is worst. Patient stated he had a bone spur years ago and was seen by another podiatrist where they gave him a steroid injection and inserts.    HPI: 77 y.o. male presenting today with c/o pain in the bottom of the bilateral heel.  Pain is rated as 8/10.  Denies injury.  Right foot is worse than the left.  He has had the symptoms in the past and did well with a cortisone injection.  Patient is also requesting nail care and his calluses be shaved.  States he has orthotics that are approximately 76 years old at home.  He is not currently wearing them for this issue.  Past Medical History:  Diagnosis Date   Abnormal myocardial perfusion study 03/11/2016   Overview:  Extensive lateral ischemia , [name] 44% in distribution of SVG to [facility] and M   Abnormal stress test 03/21/2016   Atrial fibrillation (HCC) 02/06/2021   Blood glucose elevated 08/27/2017   Cardiomegaly 08/27/2017   Cataract    Chronic diastolic heart failure (HCC) 06/13/2015   CKD (chronic kidney disease) stage 3, GFR 30-59 ml/min (HCC) 07/15/2019   Coronary atherosclerosis of native coronary artery 06/13/2015   Overview:   Cardiac cath 03/13/16: Diagnostic Summary  Severe native multivessel disease as described below  Patent LIMA to LAD  Patent SVG to PDA  Occluded SVG to OM/OM2 ( jump graft)  Occluded SVG to Diagonal  Normal LV function  Diagnostic Recommendations  PCI to the Proximal LCX  Medical therapy for the other disease  Interventional Summary  Successful PCI to the proximaL LCX with Xience 3.0 x   Dyslipidemia 06/13/2015   Esophageal reflux 08/27/2017   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   High risk medication use 06/13/2015   Overview:  Sotolol   History of colon polyps    Hypertension 12/09/1988   Hypertensive heart disease with heart failure (HCC) 06/13/2015   Kidney stone 02/06/2021    Long term current use of anticoagulant therapy 12/19/2015   Overview:   Edoxaban started 12/14/15   OSA (obstructive sleep apnea) 08/27/2017   Osteoarthritis 08/27/2017   Permanent atrial fibrillation (HCC) 06/13/2015   Persistent atrial fibrillation (HCC) 06/13/2015   Preoperative cardiovascular examination 12/30/2018   Rheumatic disease of mitral valve 08/27/2017   Rheumatic heart failure (congestive) (HCC) 08/27/2017   SBO (small bowel obstruction) (HCC)    partial s/p x-lap due to adhesions (Dr Meryl Crutch) followed by incisional hernia s/p repair.   Sleep apnea    Special screening for malignant neoplasms, colon 12/09/2008   Oct 24, 2009 Entered By: Marthann Schiller Comment: 2 polyps repeat 2013   Status post total right knee replacement 03/08/2021    Past Surgical History:  Procedure Laterality Date   A FLUTTER ABLATION     a tach/flutter   CARDIAC SURGERY  2012   CARDIOVERSION  01/19/2016   CATARACT EXTRACTION     CHOLECYSTECTOMY     COLONOSCOPY W/ POLYPECTOMY  05/28/2017   Colonic polyp status post polypectomy. Moderate predominantly sigmoid diverticulosis. Small internal hemorrhoids (likely etiology of heme-positive stools- rule out other causes)   CORONARY ANGIOPLASTY WITH STENT PLACEMENT     CORONARY ARTERY BYPASS GRAFT  2011   with Maze and LAA ligation    CORONARY STENT PLACEMENT  03/2016   ESOPHAGOGASTRODUODENOSCOPY  07/23/2017   Mild  gastritis. Small transient hiatal hernia. Otherwise normal EGD   HERNIA REPAIR     KNEE SURGERY     PROSTATE SURGERY     RECTAL SURGERY     TOTAL KNEE ARTHROPLASTY Right 02/2019   No Known Allergies   Physical Exam: General: The patient is alert and oriented x3 in no acute distress.  Dermatology:  No ecchymosis, erythema, or edema bilateral.  No open lesions.  Nails are elongated.  There are hyperkeratotic lesions submet 5 bilateral.  Vascular: Palpable pedal pulses bilaterally. Capillary refill within normal limits.  No  appreciable edema.    Neurological: Light touch sensation intact bilateral.  MMT 5/5 to lower extremity bilateral. Negative Tinel's sign with percussion of the posterior tibial nerve on the affected extremity.    Musculoskeletal Exam:  There is pain on palpation of the plantarmedial & plantarcentral aspect of bilateral heel.  No gaps or nodules within the plantar fascia.  Positive Windlass mechanism bilateral.  Antalgic gait noted with first few steps upon standing.  No pain on palpation of achilles tendon bilateral.  Ankle df less than 10 degrees with knee extended b/l.  Radiographic Exam:  Normal osseous mineralization.  No fracture seen.  There is a navicular-medial cuneiform fault noted bilateral.  Decreased calcaneal inclination angle.  No calcaneal spur noted bilateral  Assessment/Plan of Care: 1. Plantar fasciitis, bilateral   2. Capsulitis of foot     Meds ordered this encounter  Medications   triamcinolone acetonide (KENALOG) 10 MG/ML injection 10 mg   -Reviewed etiology of plantar fasciitis with patient.  Discussed treatment options with patient today, including cortisone injection, NSAID course of treatment, stretching exercises, physical therapy, use of night splint, rest, icing the heel, arch supports/orthotics, and supportive shoe gear.    With the patient's verbal consent, a corticosteroid injection was administered to the bilateral plantar heel, consisting of a mixture of 1% lidocaine plain, 0.5% Sensorcaine plain, and Kenalog-10 for a total of 1.5cc administered to each foot.  A Band-aid was applied. Pain level post-injection is 4/10.  Encourage patient to bring his old orthotics at the next visit.  If the orthotic shell is still in good condition and fits his foot well then he may be able to just get them refurbished.  He is in need of good midfoot and arch support based on the mild midfoot collapse bilateral on x-ray.  Informed patient we will follow-up in approximately 3  weeks.  We will perform nail and callus care at that time.  Return in about 3 weeks (around 09/24/2023) for [facility] / recheck plantar fasciitis / he will bring old orthotics.   Clerance Lav, DPM, FACFAS [facility]     [address] [place], [place] [address]                Office [phone]  Fax [phone]

## 2023-09-18 DIAGNOSIS — H26492 Other secondary cataract, left eye: Secondary | ICD-10-CM | POA: Diagnosis not present

## 2023-09-18 DIAGNOSIS — H02831 Dermatochalasis of right upper eyelid: Secondary | ICD-10-CM | POA: Diagnosis not present

## 2023-09-18 DIAGNOSIS — H52223 Regular astigmatism, bilateral: Secondary | ICD-10-CM | POA: Diagnosis not present

## 2023-09-18 DIAGNOSIS — H26493 Other secondary cataract, bilateral: Secondary | ICD-10-CM | POA: Diagnosis not present

## 2023-09-18 DIAGNOSIS — H26491 Other secondary cataract, right eye: Secondary | ICD-10-CM | POA: Diagnosis not present

## 2023-09-18 DIAGNOSIS — Z961 Presence of intraocular lens: Secondary | ICD-10-CM | POA: Diagnosis not present

## 2023-09-18 DIAGNOSIS — H18413 Arcus senilis, bilateral: Secondary | ICD-10-CM | POA: Diagnosis not present

## 2023-09-18 DIAGNOSIS — H43392 Other vitreous opacities, left eye: Secondary | ICD-10-CM | POA: Diagnosis not present

## 2023-09-20 DIAGNOSIS — G4733 Obstructive sleep apnea (adult) (pediatric): Secondary | ICD-10-CM | POA: Diagnosis not present

## 2023-09-23 DIAGNOSIS — G4733 Obstructive sleep apnea (adult) (pediatric): Secondary | ICD-10-CM | POA: Diagnosis not present

## 2023-09-23 DIAGNOSIS — J4520 Mild intermittent asthma, uncomplicated: Secondary | ICD-10-CM | POA: Diagnosis not present

## 2023-09-23 DIAGNOSIS — R918 Other nonspecific abnormal finding of lung field: Secondary | ICD-10-CM | POA: Diagnosis not present

## 2023-09-23 DIAGNOSIS — J301 Allergic rhinitis due to pollen: Secondary | ICD-10-CM | POA: Diagnosis not present

## 2023-09-24 ENCOUNTER — Encounter: Payer: Self-pay | Admitting: Podiatry

## 2023-09-24 ENCOUNTER — Ambulatory Visit (INDEPENDENT_AMBULATORY_CARE_PROVIDER_SITE_OTHER): Payer: Medicare Other | Admitting: Podiatry

## 2023-09-24 DIAGNOSIS — M722 Plantar fascial fibromatosis: Secondary | ICD-10-CM

## 2023-09-24 DIAGNOSIS — M79674 Pain in right toe(s): Secondary | ICD-10-CM

## 2023-09-24 DIAGNOSIS — M79675 Pain in left toe(s): Secondary | ICD-10-CM

## 2023-09-24 DIAGNOSIS — B351 Tinea unguium: Secondary | ICD-10-CM | POA: Diagnosis not present

## 2023-09-24 NOTE — Progress Notes (Addendum)
Subjective:  Patient ID: Jason Stark, male    DOB: Nov 15, 1946,  MRN: [id number]  Jason Stark presents to clinic today for:  Chief Complaint  Patient presents with   Plantar Fasciitis    Reports some improvement since injections. Has old pair of inserts with him today   Routine Foot Care    Nails and callus care   Patient notes nails are thick, discolored, elongated and painful in shoegear when trying to ambulate.  Patient states that he has had some improvement since the cortisone injections last visit.  He did bring his old orthotics for evaluation today.  He has not worn them in the past several years.  PCP is [name], PA.  Past Medical History:  Diagnosis Date   Abnormal myocardial perfusion study 03/11/2016   Overview:  Extensive lateral ischemia , [name] 44% in distribution of SVG to [facility] and M   Abnormal stress test 03/21/2016   Atrial fibrillation (HCC) 02/06/2021   Blood glucose elevated 08/27/2017   Cardiomegaly 08/27/2017   Cataract    Chronic diastolic heart failure (HCC) 06/13/2015   CKD (chronic kidney disease) stage 3, GFR 30-59 ml/min (HCC) 07/15/2019   Coronary atherosclerosis of native coronary artery 06/13/2015   Overview:   Cardiac cath 03/13/16: Diagnostic Summary  Severe native multivessel disease as described below  Patent LIMA to LAD  Patent SVG to PDA  Occluded SVG to OM/OM2 ( jump graft)  Occluded SVG to Diagonal  Normal LV function  Diagnostic Recommendations  PCI to the Proximal LCX  Medical therapy for the other disease  Interventional Summary  Successful PCI to the proximaL LCX with Xience 3.0 x   Dyslipidemia 06/13/2015   Esophageal reflux 08/27/2017   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   High risk medication use 06/13/2015   Overview:  Sotolol   History of colon polyps    Hypertension 12/09/1988   Hypertensive heart disease with heart failure (HCC) 06/13/2015   Kidney stone 02/06/2021   Long term current use of anticoagulant therapy  12/19/2015   Overview:   Edoxaban started 12/14/15   OSA (obstructive sleep apnea) 08/27/2017   Osteoarthritis 08/27/2017   Permanent atrial fibrillation (HCC) 06/13/2015   Persistent atrial fibrillation (HCC) 06/13/2015   Preoperative cardiovascular examination 12/30/2018   Rheumatic disease of mitral valve 08/27/2017   Rheumatic heart failure (congestive) (HCC) 08/27/2017   SBO (small bowel obstruction) (HCC)    partial s/p x-lap due to adhesions (Dr Meryl Crutch) followed by incisional hernia s/p repair.   Sleep apnea    Special screening for malignant neoplasms, colon 12/09/2008   Oct 24, 2009 Entered By: Marthann Schiller Comment: 2 polyps repeat 2013   Status post total right knee replacement 03/08/2021    Past Surgical History:  Procedure Laterality Date   A FLUTTER ABLATION     a tach/flutter   CARDIAC SURGERY  2012   CARDIOVERSION  01/19/2016   CATARACT EXTRACTION     CHOLECYSTECTOMY     COLONOSCOPY W/ POLYPECTOMY  05/28/2017   Colonic polyp status post polypectomy. Moderate predominantly sigmoid diverticulosis. Small internal hemorrhoids (likely etiology of heme-positive stools- rule out other causes)   CORONARY ANGIOPLASTY WITH STENT PLACEMENT     CORONARY ARTERY BYPASS GRAFT  2011   with Maze and LAA ligation    CORONARY STENT PLACEMENT  03/2016   ESOPHAGOGASTRODUODENOSCOPY  07/23/2017   Mild gastritis. Small transient hiatal hernia. Otherwise normal EGD   HERNIA REPAIR     KNEE SURGERY  PROSTATE SURGERY     RECTAL SURGERY     TOTAL KNEE ARTHROPLASTY Right 02/2019    No Known Allergies  Review of Systems: Negative except as noted in the HPI.  Objective:  There were no vitals filed for this visit.  Jason Stark is a pleasant 77 y.o. male in NAD. AAO x 3.  Vascular Examination: Capillary refill time is 3-5 seconds to toes bilateral. Palpable pedal pulses b/l LE. Digital hair present b/l.  Skin temperature gradient WNL b/l. No varicosities b/l. No  cyanosis noted b/l.   Dermatological Examination: Pedal skin with normal turgor, texture and tone b/l. No open wounds. No interdigital macerations b/l. Toenails x10 are 3mm thick, discolored, dystrophic with subungual debris. There is pain with compression of the nail plates.  They are elongated x10  Neurological Examination: Protective sensation intact bilateral LE. Vibratory sensation intact bilateral LE.  Musculoskeletal Examination: Muscle strength 5/5 to all LE muscle groups b/l.  Minimal pain on palpation plantar aspect of both heels.  Assessment/Plan: 1. Pain due to onychomycosis of toenails of both feet   2. Plantar fasciitis, bilateral     Patient's old orthotics were evaluated today.  They are three-quarter length orthotic with no top-cover.  Overall the fit is good as far as arch height and support, but they do have a slightly narrow grind.  I feel if we add a padded top-cover that is full-length this will help fill in the shoe a little bit more.  Once we can achieve this he will start wearing those in his shoes.  These were taken today and will be dropped off with our pedorthist to see if she can add the top-cover.  Tracings of his feet were also obtained today.  Continue with stretching exercises.  The mycotic toenails were sharply debrided x10 with sterile nail nippers and a power debriding burr to decrease bulk/thickness and length.    Return in about 3 months (around 12/25/2023) for RFC.   Clerance Lav, DPM, FACFAS [facility]     [address] [place], [place] [address]                Office [phone]  Fax [phone]

## 2023-09-30 DIAGNOSIS — R3129 Other microscopic hematuria: Secondary | ICD-10-CM | POA: Diagnosis not present

## 2023-09-30 DIAGNOSIS — N401 Enlarged prostate with lower urinary tract symptoms: Secondary | ICD-10-CM | POA: Diagnosis not present

## 2023-09-30 DIAGNOSIS — R351 Nocturia: Secondary | ICD-10-CM | POA: Diagnosis not present

## 2023-10-11 ENCOUNTER — Other Ambulatory Visit: Payer: Self-pay | Admitting: Cardiology

## 2023-10-20 DIAGNOSIS — D649 Anemia, unspecified: Secondary | ICD-10-CM | POA: Diagnosis not present

## 2023-10-20 DIAGNOSIS — I482 Chronic atrial fibrillation, unspecified: Secondary | ICD-10-CM | POA: Diagnosis not present

## 2023-10-20 DIAGNOSIS — J449 Chronic obstructive pulmonary disease, unspecified: Secondary | ICD-10-CM | POA: Diagnosis not present

## 2023-10-20 DIAGNOSIS — Z9181 History of falling: Secondary | ICD-10-CM | POA: Diagnosis not present

## 2023-10-20 DIAGNOSIS — I700 Atherosclerosis of aorta: Secondary | ICD-10-CM | POA: Diagnosis not present

## 2023-10-20 DIAGNOSIS — Z2821 Immunization not carried out because of patient refusal: Secondary | ICD-10-CM | POA: Diagnosis not present

## 2023-10-20 DIAGNOSIS — I5032 Chronic diastolic (congestive) heart failure: Secondary | ICD-10-CM | POA: Diagnosis not present

## 2023-10-20 DIAGNOSIS — E785 Hyperlipidemia, unspecified: Secondary | ICD-10-CM | POA: Diagnosis not present

## 2023-10-20 DIAGNOSIS — I130 Hypertensive heart and chronic kidney disease with heart failure and stage 1 through stage 4 chronic kidney disease, or unspecified chronic kidney disease: Secondary | ICD-10-CM | POA: Diagnosis not present

## 2023-10-20 DIAGNOSIS — R7303 Prediabetes: Secondary | ICD-10-CM | POA: Diagnosis not present

## 2023-10-20 DIAGNOSIS — Z6834 Body mass index (BMI) 34.0-34.9, adult: Secondary | ICD-10-CM | POA: Diagnosis not present

## 2023-10-20 DIAGNOSIS — N182 Chronic kidney disease, stage 2 (mild): Secondary | ICD-10-CM | POA: Diagnosis not present

## 2023-11-04 DIAGNOSIS — J301 Allergic rhinitis due to pollen: Secondary | ICD-10-CM | POA: Diagnosis not present

## 2023-11-04 DIAGNOSIS — G4733 Obstructive sleep apnea (adult) (pediatric): Secondary | ICD-10-CM | POA: Diagnosis not present

## 2023-11-04 DIAGNOSIS — R918 Other nonspecific abnormal finding of lung field: Secondary | ICD-10-CM | POA: Diagnosis not present

## 2023-11-04 DIAGNOSIS — J4520 Mild intermittent asthma, uncomplicated: Secondary | ICD-10-CM | POA: Diagnosis not present

## 2023-12-23 DIAGNOSIS — Z961 Presence of intraocular lens: Secondary | ICD-10-CM | POA: Diagnosis not present

## 2023-12-23 DIAGNOSIS — H43392 Other vitreous opacities, left eye: Secondary | ICD-10-CM | POA: Diagnosis not present

## 2023-12-23 DIAGNOSIS — H26492 Other secondary cataract, left eye: Secondary | ICD-10-CM | POA: Diagnosis not present

## 2023-12-25 ENCOUNTER — Ambulatory Visit: Payer: Medicare Other | Admitting: Podiatry

## 2023-12-25 DIAGNOSIS — M79675 Pain in left toe(s): Secondary | ICD-10-CM

## 2023-12-25 DIAGNOSIS — M79674 Pain in right toe(s): Secondary | ICD-10-CM

## 2023-12-25 DIAGNOSIS — B351 Tinea unguium: Secondary | ICD-10-CM | POA: Diagnosis not present

## 2023-12-25 NOTE — Progress Notes (Signed)
Subjective:  Patient ID: Jason Stark, male    DOB: [birth year],  MRN: [id number]  [name] presents to clinic today for:  Chief Complaint  Patient presents with   [facility]    RFC with some callouses. Not Diabetic, takes Savay SA. Sees PCP in March.    Patient notes nails are thick, discolored, elongated and painful in shoegear when trying to ambulate.  Patient's met -length orthotics were given to our orthotics department after his last visit to have a full-length padded topcover applied, but patient states he has never been contacted to pick them up yet.  He is wondering if they are here today (they are not at this office today)  PCP is [name], PA.  Past Medical History:  Diagnosis Date   Abnormal myocardial perfusion study 03/11/2016   Overview:  Extensive lateral ischemia , [name] 44% in distribution of SVG to [facility] and M   Abnormal stress test 03/21/2016   Atrial fibrillation (HCC) 02/06/2021   Blood glucose elevated 08/27/2017   Cardiomegaly 08/27/2017   Cataract    Chronic diastolic heart failure (HCC) 06/13/2015   CKD (chronic kidney disease) stage 3, GFR 30-59 ml/min (HCC) 07/15/2019   Coronary atherosclerosis of native coronary artery 06/13/2015   Overview:   Cardiac cath 03/13/16: Diagnostic Summary  Severe native multivessel disease as described below  Patent LIMA to LAD  Patent SVG to PDA  Occluded SVG to OM/OM2 ( jump graft)  Occluded SVG to Diagonal  Normal LV function  Diagnostic Recommendations  PCI to the Proximal LCX  Medical therapy for the other disease  Interventional Summary  Successful PCI to the proximaL LCX with Xience 3.0 x   Dyslipidemia 06/13/2015   Esophageal reflux 08/27/2017   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   High risk medication use 06/13/2015   Overview:  Sotolol   History of colon polyps    Hypertension 12/09/1988   Hypertensive heart disease with heart failure (HCC) 06/13/2015   Kidney stone 02/06/2021   Long term current use of  anticoagulant therapy 12/19/2015   Overview:   Edoxaban started 12/14/15   OSA (obstructive sleep apnea) 08/27/2017   Osteoarthritis 08/27/2017   Permanent atrial fibrillation (HCC) 06/13/2015   Persistent atrial fibrillation (HCC) 06/13/2015   Preoperative cardiovascular examination 12/30/2018   Rheumatic disease of mitral valve 08/27/2017   Rheumatic heart failure (congestive) (HCC) 08/27/2017   SBO (small bowel obstruction) (HCC)    partial s/p x-lap due to adhesions (Dr Meryl Crutch) followed by incisional hernia s/p repair.   Sleep apnea    Special screening for malignant neoplasms, colon 12/09/2008   Oct 24, 2009 Entered By: Marthann Schiller Comment: 2 polyps repeat 2013   Status post total right knee replacement 03/08/2021    Past Surgical History:  Procedure Laterality Date   A FLUTTER ABLATION     a tach/flutter   CARDIAC SURGERY  2012   CARDIOVERSION  01/19/2016   CATARACT EXTRACTION     CHOLECYSTECTOMY     COLONOSCOPY W/ POLYPECTOMY  05/28/2017   Colonic polyp status post polypectomy. Moderate predominantly sigmoid diverticulosis. Small internal hemorrhoids (likely etiology of heme-positive stools- rule out other causes)   CORONARY ANGIOPLASTY WITH STENT PLACEMENT     CORONARY ARTERY BYPASS GRAFT  2011   with Maze and LAA ligation    CORONARY STENT PLACEMENT  03/2016   ESOPHAGOGASTRODUODENOSCOPY  07/23/2017   Mild gastritis. Small transient hiatal hernia. Otherwise normal EGD   HERNIA REPAIR  KNEE SURGERY     PROSTATE SURGERY     RECTAL SURGERY     TOTAL KNEE ARTHROPLASTY Right 02/2019   No Known Allergies  Review of Systems: Negative except as noted in the HPI.  Objective:  Jason Stark is a pleasant 78 y.o. male in NAD. AAO x 3.  Vascular Examination: Capillary refill time is 3-5 seconds to toes bilateral. Palpable pedal pulses b/l LE. Digital hair present b/l.  Skin temperature gradient WNL b/l. No varicosities b/l. No cyanosis noted b/l.    Dermatological Examination: Pedal skin with normal turgor, texture and tone b/l. No open wounds. No interdigital macerations b/l. Toenails x10 are 3mm thick, discolored, dystrophic with subungual debris. There is pain with compression of the nail plates.  They are elongated x10  Assessment/Plan: 1. Pain due to onychomycosis of toenails of both feet    The mycotic toenails were sharply debrided x10 with sterile nail nippers and a power debriding burr to decrease bulk/thickness and length.    Reached out to our pedorthist to look into the issue with his orthotics and will have her contact him directly to communicate the status of his refurbishment of his orthotics.    Return in about 3 months (around 03/24/2024) for RFC.   Clerance Lav, DPM, FACFAS [facility]     [address] [place], [place] [address]                Office [phone]  Fax [phone]

## 2023-12-26 DIAGNOSIS — R059 Cough, unspecified: Secondary | ICD-10-CM | POA: Diagnosis not present

## 2023-12-26 DIAGNOSIS — J449 Chronic obstructive pulmonary disease, unspecified: Secondary | ICD-10-CM | POA: Diagnosis not present

## 2023-12-26 DIAGNOSIS — J069 Acute upper respiratory infection, unspecified: Secondary | ICD-10-CM | POA: Diagnosis not present

## 2023-12-29 DIAGNOSIS — R918 Other nonspecific abnormal finding of lung field: Secondary | ICD-10-CM | POA: Diagnosis not present

## 2023-12-29 DIAGNOSIS — G4733 Obstructive sleep apnea (adult) (pediatric): Secondary | ICD-10-CM | POA: Diagnosis not present

## 2023-12-29 DIAGNOSIS — J301 Allergic rhinitis due to pollen: Secondary | ICD-10-CM | POA: Diagnosis not present

## 2023-12-29 DIAGNOSIS — J4520 Mild intermittent asthma, uncomplicated: Secondary | ICD-10-CM | POA: Diagnosis not present

## 2023-12-29 NOTE — Progress Notes (Unsigned)
Cardiology Office Note:    Date:  12/30/2023   ID:  Jason Stark, DOB Feb 05, 1946, MRN [id number]  PCP:  Hurshel Party, NP  Cardiologist:  Norman Herrlich, MD    Referring MD: [name], PA    ASSESSMENT:    1. Permanent atrial fibrillation (HCC)   2. Chronic anticoagulation   3. Coronary artery disease of native artery of native heart with stable angina pectoris (HCC)   4. Mixed hyperlipidemia   5. Hypertensive heart disease with heart failure (HCC)   6. Stage 3 chronic kidney disease, unspecified whether stage 3a or 3b CKD (HCC)    PLAN:    In order of problems listed above:  Khamar continues to do well with his heart disease he is in rate controlled atrial fibrillation continue his beta-blocker and current anticoagulant without bleeding complication Stable CAD following CABG and PCI having anginal discomfort at this time I would not advise an ischemia evaluation Stable hyperlipidemia continue combined Zetia and low intensity pravastatin he did not tolerate higher intensity statins in the past Heart failure is nicely compensated continue his loop diuretic along with ACE inhibitor calcium channel blocker and beta-blocker BP at target Stable CKD followed by his PCP   Next appointment: 6 months   Medication Adjustments/Labs and Tests Ordered: Current medicines are reviewed at length with the patient today.  Concerns regarding medicines are outlined above.  No orders of the defined types were placed in this encounter.  No orders of the defined types were placed in this encounter.    History of Present Illness:    Jason Stark is a 78 y.o. male with a hx of chronic permanent atrial fibrillation on chronic anticoagulation hypertensive heart disease with chronic diastolic heart failure and stage III CKD CAD with CABG as well as PCI of the native left circumflex coronary artery April 2017 mild mitral and aortic regurgitation and hyperlipidemia last seen  05/16/2023.  Compliance with diet, lifestyle and medications: Yes  Sadly his wife passed about 4 months ago in hospice with severe dementia Recently he had an upper respiratory infection COVID influenza test normal treated for bronchitis he is improving but still has a cough and purulent sputum He has no edema shortness of breath orthopnea chest pain palpitation or syncope He tolerates his anticoagulant without bleeding and recent labs show normal hemoglobin And has no muscle pain or weakness with his statin therapy  Recent labs with his PCP 10/20/2023 LDL 77 cholesterol 129 non-HDL cholesterol 99 A1c 6.8 creatinine 1.15 hemoglobin 14.9 Past Medical History:  Diagnosis Date   Abnormal myocardial perfusion study 03/11/2016   Overview:  Extensive lateral ischemia , [name] 44% in distribution of SVG to [facility] and M   Abnormal stress test 03/21/2016   Atrial fibrillation (HCC) 02/06/2021   Blood glucose elevated 08/27/2017   Cardiomegaly 08/27/2017   Cataract    Chronic diastolic heart failure (HCC) 06/13/2015   CKD (chronic kidney disease) stage 3, GFR 30-59 ml/min (HCC) 07/15/2019   Coronary atherosclerosis of native coronary artery 06/13/2015   Overview:   Cardiac cath 03/13/16: Diagnostic Summary  Severe native multivessel disease as described below  Patent LIMA to LAD  Patent SVG to PDA  Occluded SVG to OM/OM2 ( jump graft)  Occluded SVG to Diagonal  Normal LV function  Diagnostic Recommendations  PCI to the Proximal LCX  Medical therapy for the other disease  Interventional Summary  Successful PCI to the proximaL LCX with Xience 3.0 x   Dyslipidemia 06/13/2015  Esophageal reflux 08/27/2017   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   High risk medication use 06/13/2015   Overview:  Sotolol   History of colon polyps    Hypertension 12/09/1988   Hypertensive heart disease with heart failure (HCC) 06/13/2015   Kidney stone 02/06/2021   Long term current use of anticoagulant therapy 12/19/2015    Overview:   Edoxaban started 12/14/15   OSA (obstructive sleep apnea) 08/27/2017   Osteoarthritis 08/27/2017   Permanent atrial fibrillation (HCC) 06/13/2015   Persistent atrial fibrillation (HCC) 06/13/2015   Preoperative cardiovascular examination 12/30/2018   Rheumatic disease of mitral valve 08/27/2017   Rheumatic heart failure (congestive) (HCC) 08/27/2017   SBO (small bowel obstruction) (HCC)    partial s/p x-lap due to adhesions (Dr Meryl Crutch) followed by incisional hernia s/p repair.   Sleep apnea    Special screening for malignant neoplasms, colon 12/09/2008   Oct 24, 2009 Entered By: Marthann Schiller Comment: 2 polyps repeat 2013   Status post total right knee replacement 03/08/2021    Current Medications: Current Meds  Medication Sig   acetaminophen (TYLENOL) 650 MG CR tablet Take 1,300 mg by mouth 2 (two) times daily as needed for pain.   amLODipine-benazepril (LOTREL) 5-20 MG capsule Take 1 capsule by mouth 2 (two) times daily.    amoxicillin (AMOXIL) 875 MG tablet Take 875 mg by mouth 2 (two) times daily.   benzonatate (TESSALON) 200 MG capsule Take 200 mg by mouth 3 (three) times daily as needed.   Cholecalciferol (VITAMIN D-3) 125 MCG (5000 UT) TABS Take 1 tablet by mouth daily.   Docusate Calcium (STOOL SOFTENER PO) Take 1 capsule by mouth daily as needed (constipation).   ezetimibe (ZETIA) 10 MG tablet Take 10 mg by mouth daily.   famotidine (PEPCID) 20 MG tablet Take 20 mg by mouth daily.   ferrous sulfate 325 (65 FE) MG tablet Take 324 mg by mouth daily.   loratadine (CLARITIN) 10 MG tablet Take 10 mg by mouth daily.    metoprolol tartrate (LOPRESSOR) 25 MG tablet Take 25 mg by mouth daily.   montelukast (SINGULAIR) 10 MG tablet Take 10 mg by mouth daily.   potassium chloride (KLOR-CON) 10 MEQ tablet TAKE 2 TABLETS DAILY   pravastatin (PRAVACHOL) 40 MG tablet Take 1 tablet (40 mg total) by mouth daily.   promethazine-dextromethorphan (PROMETHAZINE-DM) 6.25-15  MG/5ML syrup Take 5 mLs by mouth every 4 (four) hours as needed.   SAVAYSA 60 MG TABS tablet TAKE 1 TABLET DAILY   tamsulosin (FLOMAX) 0.4 MG CAPS capsule Take 0.4 mg by mouth daily.   torsemide (DEMADEX) 20 MG tablet Take 20 mg by mouth daily.      EKGs/Labs/Other Studies Reviewed:    The following studies were reviewed today:  No EKG this visit      Recent Labs: No results found for requested labs within last 365 days.  Recent Lipid Panel    Component Value Date/Time   CHOL 107 07/15/2019 1149   TRIG 146 07/15/2019 1149   HDL 26 (L) 07/15/2019 1149   CHOLHDL 4.1 07/15/2019 1149   LDLCALC 52 07/15/2019 1149    Physical Exam:    VS:  BP 122/60   Pulse 87   Ht 5\' 11"  (1.803 m)   Wt 236 lb 3.2 oz (107.1 kg)   SpO2 93%   BMI 32.94 kg/m     Wt Readings from Last 3 Encounters:  12/30/23 236 lb 3.2 oz (107.1 kg)  05/16/23 232 lb 9.6 oz (105.5  kg)  05/15/22 221 lb 9.6 oz (100.5 kg)     GEN:  Well nourished, well developed in no acute distress HEENT: Normal NECK: No JVD; No carotid bruits LYMPHATICS: No lymphadenopathy CARDIAC: Variable first heart sound irregular heart rhythm RRR, no murmurs, rubs, gallops RESPIRATORY:  Clear to auscultation without rales, wheezing or rhonchi  ABDOMEN: Soft, non-tender, non-distended MUSCULOSKELETAL:  No edema; No deformity  SKIN: Warm and dry NEUROLOGIC:  Alert and oriented x 3 PSYCHIATRIC:  Normal affect    Signed, Norman Herrlich, MD  12/30/2023 8:43 AM    [facility]

## 2023-12-30 ENCOUNTER — Encounter: Payer: Self-pay | Admitting: Cardiology

## 2023-12-30 ENCOUNTER — Ambulatory Visit: Payer: Medicare Other | Attending: Cardiology | Admitting: Cardiology

## 2023-12-30 VITALS — BP 122/60 | HR 87 | Ht 71.0 in | Wt 236.2 lb

## 2023-12-30 DIAGNOSIS — N183 Chronic kidney disease, stage 3 unspecified: Secondary | ICD-10-CM | POA: Diagnosis not present

## 2023-12-30 DIAGNOSIS — I25118 Atherosclerotic heart disease of native coronary artery with other forms of angina pectoris: Secondary | ICD-10-CM | POA: Diagnosis not present

## 2023-12-30 DIAGNOSIS — I4821 Permanent atrial fibrillation: Secondary | ICD-10-CM | POA: Diagnosis not present

## 2023-12-30 DIAGNOSIS — I110 Hypertensive heart disease with heart failure: Secondary | ICD-10-CM | POA: Diagnosis not present

## 2023-12-30 DIAGNOSIS — E782 Mixed hyperlipidemia: Secondary | ICD-10-CM | POA: Insufficient documentation

## 2023-12-30 DIAGNOSIS — Z7901 Long term (current) use of anticoagulants: Secondary | ICD-10-CM | POA: Insufficient documentation

## 2023-12-30 NOTE — Patient Instructions (Signed)

## 2023-12-31 ENCOUNTER — Telehealth: Payer: Self-pay

## 2023-12-31 DIAGNOSIS — M722 Plantar fascial fibromatosis: Secondary | ICD-10-CM

## 2023-12-31 NOTE — Telephone Encounter (Signed)
SPOKE TO PT ABOUT REFURB STATED ITS $90 AND HE CAN PICK THEM UP IN [place] BETWEEN 1-5PM

## 2023-12-31 NOTE — Telephone Encounter (Signed)
Sending orthotics with Carlyon Shadow. To [employer]

## 2024-02-09 DIAGNOSIS — Z122 Encounter for screening for malignant neoplasm of respiratory organs: Secondary | ICD-10-CM | POA: Diagnosis not present

## 2024-02-09 DIAGNOSIS — Z87891 Personal history of nicotine dependence: Secondary | ICD-10-CM | POA: Diagnosis not present

## 2024-02-17 DIAGNOSIS — J449 Chronic obstructive pulmonary disease, unspecified: Secondary | ICD-10-CM | POA: Diagnosis not present

## 2024-02-17 DIAGNOSIS — Z2821 Immunization not carried out because of patient refusal: Secondary | ICD-10-CM | POA: Diagnosis not present

## 2024-02-17 DIAGNOSIS — Z0000 Encounter for general adult medical examination without abnormal findings: Secondary | ICD-10-CM | POA: Diagnosis not present

## 2024-02-17 DIAGNOSIS — N182 Chronic kidney disease, stage 2 (mild): Secondary | ICD-10-CM | POA: Diagnosis not present

## 2024-02-17 DIAGNOSIS — I130 Hypertensive heart and chronic kidney disease with heart failure and stage 1 through stage 4 chronic kidney disease, or unspecified chronic kidney disease: Secondary | ICD-10-CM | POA: Diagnosis not present

## 2024-02-17 DIAGNOSIS — Z6833 Body mass index (BMI) 33.0-33.9, adult: Secondary | ICD-10-CM | POA: Diagnosis not present

## 2024-02-17 DIAGNOSIS — E1169 Type 2 diabetes mellitus with other specified complication: Secondary | ICD-10-CM | POA: Diagnosis not present

## 2024-02-17 DIAGNOSIS — I482 Chronic atrial fibrillation, unspecified: Secondary | ICD-10-CM | POA: Diagnosis not present

## 2024-02-17 DIAGNOSIS — Z9181 History of falling: Secondary | ICD-10-CM | POA: Diagnosis not present

## 2024-02-17 DIAGNOSIS — D649 Anemia, unspecified: Secondary | ICD-10-CM | POA: Diagnosis not present

## 2024-02-17 DIAGNOSIS — R7303 Prediabetes: Secondary | ICD-10-CM | POA: Diagnosis not present

## 2024-02-17 DIAGNOSIS — I5032 Chronic diastolic (congestive) heart failure: Secondary | ICD-10-CM | POA: Diagnosis not present

## 2024-02-17 DIAGNOSIS — E785 Hyperlipidemia, unspecified: Secondary | ICD-10-CM | POA: Diagnosis not present

## 2024-02-19 DIAGNOSIS — J4520 Mild intermittent asthma, uncomplicated: Secondary | ICD-10-CM | POA: Diagnosis not present

## 2024-02-19 DIAGNOSIS — J301 Allergic rhinitis due to pollen: Secondary | ICD-10-CM | POA: Diagnosis not present

## 2024-02-19 DIAGNOSIS — R918 Other nonspecific abnormal finding of lung field: Secondary | ICD-10-CM | POA: Diagnosis not present

## 2024-02-19 DIAGNOSIS — G4733 Obstructive sleep apnea (adult) (pediatric): Secondary | ICD-10-CM | POA: Diagnosis not present

## 2024-03-23 DIAGNOSIS — H02834 Dermatochalasis of left upper eyelid: Secondary | ICD-10-CM | POA: Diagnosis not present

## 2024-03-23 DIAGNOSIS — Z961 Presence of intraocular lens: Secondary | ICD-10-CM | POA: Diagnosis not present

## 2024-03-23 DIAGNOSIS — H5213 Myopia, bilateral: Secondary | ICD-10-CM | POA: Diagnosis not present

## 2024-03-23 DIAGNOSIS — H18413 Arcus senilis, bilateral: Secondary | ICD-10-CM | POA: Diagnosis not present

## 2024-03-23 DIAGNOSIS — H43392 Other vitreous opacities, left eye: Secondary | ICD-10-CM | POA: Diagnosis not present

## 2024-03-23 DIAGNOSIS — H26492 Other secondary cataract, left eye: Secondary | ICD-10-CM | POA: Diagnosis not present

## 2024-03-24 ENCOUNTER — Ambulatory Visit (INDEPENDENT_AMBULATORY_CARE_PROVIDER_SITE_OTHER): Payer: Medicare Other | Admitting: Podiatry

## 2024-03-24 DIAGNOSIS — M79674 Pain in right toe(s): Secondary | ICD-10-CM

## 2024-03-24 DIAGNOSIS — B351 Tinea unguium: Secondary | ICD-10-CM | POA: Diagnosis not present

## 2024-03-24 DIAGNOSIS — I739 Peripheral vascular disease, unspecified: Secondary | ICD-10-CM

## 2024-03-24 DIAGNOSIS — M79675 Pain in left toe(s): Secondary | ICD-10-CM

## 2024-03-24 NOTE — Progress Notes (Signed)
 Subjective:  Patient ID: Jason Stark, male    DOB: [birth year],  MRN: [id number]  Jason Stark presents to clinic today for:  Chief Complaint  Patient presents with   [facility]    RFC with out callous. Last A1c unknown, but patient states he was told he was pre-diabetic. No FBS. No anti coag.    Patient notes nails are thick and elongated, causing pain in shoe gear when ambulating.  He thinks he might have a callus or hard spot underneath the right fifth metatarsal head.  PCP is Moon, Amy A, NP.  Past Medical History:  Diagnosis Date   Abnormal myocardial perfusion study 03/11/2016   Overview:  Extensive lateral ischemia , [name] 44% in distribution of SVG to [facility] and M   Abnormal stress test 03/21/2016   Atrial fibrillation (HCC) 02/06/2021   Blood glucose elevated 08/27/2017   Cardiomegaly 08/27/2017   Cataract    Chronic diastolic heart failure (HCC) 06/13/2015   CKD (chronic kidney disease) stage 3, GFR 30-59 ml/min (HCC) 07/15/2019   Coronary atherosclerosis of native coronary artery 06/13/2015   Overview:   Cardiac cath 03/13/16: Diagnostic Summary  Severe native multivessel disease as described below  Patent LIMA to LAD  Patent SVG to PDA  Occluded SVG to OM/OM2 ( jump graft)  Occluded SVG to Diagonal  Normal LV function  Diagnostic Recommendations  PCI to the Proximal LCX  Medical therapy for the other disease  Interventional Summary  Successful PCI to the proximaL LCX with Xience 3.0 x   Dyslipidemia 06/13/2015   Esophageal reflux 08/27/2017   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   High risk medication use 06/13/2015   Overview:  Sotolol   History of colon polyps    Hypertension 12/09/1988   Hypertensive heart disease with heart failure (HCC) 06/13/2015   Kidney stone 02/06/2021   Long term current use of anticoagulant therapy 12/19/2015   Overview:   Edoxaban started 12/14/15   OSA (obstructive sleep apnea) 08/27/2017   Osteoarthritis 08/27/2017   Permanent atrial  fibrillation (HCC) 06/13/2015   Persistent atrial fibrillation (HCC) 06/13/2015   Preoperative cardiovascular examination 12/30/2018   Rheumatic disease of mitral valve 08/27/2017   Rheumatic heart failure (congestive) (HCC) 08/27/2017   SBO (small bowel obstruction) (HCC)    partial s/p x-lap due to adhesions (Dr Meryl Crutch) followed by incisional hernia s/p repair.   Sleep apnea    Special screening for malignant neoplasms, colon 12/09/2008   Oct 24, 2009 Entered By: Marthann Schiller Comment: 2 polyps repeat 2013   Status post total right knee replacement 03/08/2021    No Known Allergies  Objective:  Jason Stark is a pleasant 78 y.o. male in NAD. AAO x 3.  Vascular Examination: Patient has palpable DP pulse, absent PT pulse bilateral.  Delayed capillary refill bilateral toes.  Sparse digital hair bilateral.  Proximal to distal cooling WNL bilateral.    Dermatological Examination: Interspaces are clear with no open lesions noted bilateral.  Skin is shiny and atrophic bilateral.  Nails are 3-1mm thick, with yellowish/brown discoloration, subungual debris and distal onycholysis x10.  There is pain with compression of nails x10.  There is a tiny hyperkeratotic lesion noted right submet 5 .  Patient qualifies for at-risk foot care because of PVD .  Assessment/Plan: 1. Pain due to onychomycosis of toenails of both feet   2. PVD (peripheral vascular disease) (HCC)    Mycotic nails x10 were sharply debrided with sterile nail nippers and power debriding burr to decrease  bulk and length.  The tiny hyperkeratotic lesion was shaved with #312 blade.  Discussed diabetic shoe program with the patient, but he needs a formal diagnosis of diabetes in order to proceed with the shoe program.  He understood.    Return in about 3 months (around 06/23/2024) for RFC.   Joe Murders, DPM, FACFAS [facility]     [address] [place], [place] [address]                Office [phone]  Fax [phone]

## 2024-04-01 DIAGNOSIS — R918 Other nonspecific abnormal finding of lung field: Secondary | ICD-10-CM | POA: Diagnosis not present

## 2024-04-01 DIAGNOSIS — N401 Enlarged prostate with lower urinary tract symptoms: Secondary | ICD-10-CM | POA: Diagnosis not present

## 2024-04-01 DIAGNOSIS — J4520 Mild intermittent asthma, uncomplicated: Secondary | ICD-10-CM | POA: Diagnosis not present

## 2024-04-01 DIAGNOSIS — R3129 Other microscopic hematuria: Secondary | ICD-10-CM | POA: Diagnosis not present

## 2024-04-01 DIAGNOSIS — R351 Nocturia: Secondary | ICD-10-CM | POA: Diagnosis not present

## 2024-04-01 DIAGNOSIS — Z125 Encounter for screening for malignant neoplasm of prostate: Secondary | ICD-10-CM | POA: Diagnosis not present

## 2024-04-01 DIAGNOSIS — G4733 Obstructive sleep apnea (adult) (pediatric): Secondary | ICD-10-CM | POA: Diagnosis not present

## 2024-04-01 DIAGNOSIS — J301 Allergic rhinitis due to pollen: Secondary | ICD-10-CM | POA: Diagnosis not present

## 2024-04-20 ENCOUNTER — Other Ambulatory Visit: Payer: Self-pay | Admitting: Cardiology

## 2024-06-02 DIAGNOSIS — R918 Other nonspecific abnormal finding of lung field: Secondary | ICD-10-CM | POA: Diagnosis not present

## 2024-06-02 DIAGNOSIS — J301 Allergic rhinitis due to pollen: Secondary | ICD-10-CM | POA: Diagnosis not present

## 2024-06-02 DIAGNOSIS — J4520 Mild intermittent asthma, uncomplicated: Secondary | ICD-10-CM | POA: Diagnosis not present

## 2024-06-02 DIAGNOSIS — G4733 Obstructive sleep apnea (adult) (pediatric): Secondary | ICD-10-CM | POA: Diagnosis not present

## 2024-06-09 ENCOUNTER — Telehealth: Payer: Self-pay | Admitting: Cardiology

## 2024-06-09 MED ORDER — POTASSIUM CHLORIDE ER 10 MEQ PO TBCR: 20.0000 meq | EXTENDED_RELEASE_TABLET | Freq: Every day | ORAL | 1 refills | Status: DC

## 2024-06-09 NOTE — Telephone Encounter (Signed)
 Pt's medication was sent to pt's pharmacy as requested. Confirmation received.

## 2024-06-09 NOTE — Telephone Encounter (Signed)
*  STAT* If patient is at the pharmacy, call can be transferred to refill team.   1. Which medications need to be refilled? (please list name of each medication and dose if known)   potassium chloride  (KLOR-CON ) 10 MEQ tablet   2. Would you like to learn more about the convenience, safety, & potential cost savings by using the [facility]?   3. Are you open to using the [facility] (Type [facility]. ).  4. Which pharmacy/location (including street and city if local pharmacy) is medication to be sent to?  [facility] - [place], [place] - [address]   5. Do they need a 30 day or 90 day supply?  90 day  Patient stated he still has some medication.

## 2024-06-21 DIAGNOSIS — J449 Chronic obstructive pulmonary disease, unspecified: Secondary | ICD-10-CM | POA: Diagnosis not present

## 2024-06-21 DIAGNOSIS — E785 Hyperlipidemia, unspecified: Secondary | ICD-10-CM | POA: Diagnosis not present

## 2024-06-21 DIAGNOSIS — I482 Chronic atrial fibrillation, unspecified: Secondary | ICD-10-CM | POA: Diagnosis not present

## 2024-06-21 DIAGNOSIS — D649 Anemia, unspecified: Secondary | ICD-10-CM | POA: Diagnosis not present

## 2024-06-21 DIAGNOSIS — E1169 Type 2 diabetes mellitus with other specified complication: Secondary | ICD-10-CM | POA: Diagnosis not present

## 2024-06-21 DIAGNOSIS — I5032 Chronic diastolic (congestive) heart failure: Secondary | ICD-10-CM | POA: Diagnosis not present

## 2024-06-21 DIAGNOSIS — Z6833 Body mass index (BMI) 33.0-33.9, adult: Secondary | ICD-10-CM | POA: Diagnosis not present

## 2024-06-21 DIAGNOSIS — I130 Hypertensive heart and chronic kidney disease with heart failure and stage 1 through stage 4 chronic kidney disease, or unspecified chronic kidney disease: Secondary | ICD-10-CM | POA: Diagnosis not present

## 2024-06-21 DIAGNOSIS — N182 Chronic kidney disease, stage 2 (mild): Secondary | ICD-10-CM | POA: Diagnosis not present

## 2024-06-21 DIAGNOSIS — Z6832 Body mass index (BMI) 32.0-32.9, adult: Secondary | ICD-10-CM | POA: Diagnosis not present

## 2024-06-23 ENCOUNTER — Ambulatory Visit: Admitting: Podiatry

## 2024-06-23 DIAGNOSIS — M79674 Pain in right toe(s): Secondary | ICD-10-CM | POA: Diagnosis not present

## 2024-06-23 DIAGNOSIS — I739 Peripheral vascular disease, unspecified: Secondary | ICD-10-CM | POA: Diagnosis not present

## 2024-06-23 DIAGNOSIS — L84 Corns and callosities: Secondary | ICD-10-CM

## 2024-06-23 DIAGNOSIS — M79675 Pain in left toe(s): Secondary | ICD-10-CM | POA: Diagnosis not present

## 2024-06-23 DIAGNOSIS — B351 Tinea unguium: Secondary | ICD-10-CM

## 2024-06-23 NOTE — Progress Notes (Signed)
 Subjective:  Patient ID: Jason Stark, male    DOB: 04-05-1946,  MRN: [id number]  Jason Stark presents to clinic today for:  Chief Complaint  Patient presents with   [facility]    Not diabetic. Takes Savaysa  for anticoag. Callus on left foot, lateral side near 5th toe and right foot lateral side near 1st toe.    Patient notes nails are thick and elongated, causing pain in shoe gear when ambulating.  Patient has painful calluses bilateral submet 5  PCP is Moon, Amy A, NP.  Last seen around 02/17/2024  Past Medical History:  Diagnosis Date   Abnormal myocardial perfusion study 03/11/2016   Overview:  Extensive lateral ischemia , [name] 44% in distribution of SVG to [facility] and M   Abnormal stress test 03/21/2016   Atrial fibrillation (HCC) 02/06/2021   Blood glucose elevated 08/27/2017   Cardiomegaly 08/27/2017   Cataract    Chronic diastolic heart failure (HCC) 06/13/2015   CKD (chronic kidney disease) stage 3, GFR 30-59 ml/min (HCC) 07/15/2019   Coronary atherosclerosis of native coronary artery 06/13/2015   Overview:   Cardiac cath 03/13/16: Diagnostic Summary  Severe native multivessel disease as described below  Patent LIMA to LAD  Patent SVG to PDA  Occluded SVG to OM/OM2 ( jump graft)  Occluded SVG to Diagonal  Normal LV function  Diagnostic Recommendations  PCI to the Proximal LCX  Medical therapy for the other disease  Interventional Summary  Successful PCI to the proximaL LCX with Xience 3.0 x   Dyslipidemia 06/13/2015   Esophageal reflux 08/27/2017   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   High risk medication use 06/13/2015   Overview:  Sotolol   History of colon polyps    Hypertension 12/09/1988   Hypertensive heart disease with heart failure (HCC) 06/13/2015   Kidney stone 02/06/2021   Long term current use of anticoagulant therapy 12/19/2015   Overview:   Edoxaban  started 12/14/15   OSA (obstructive sleep apnea) 08/27/2017   Osteoarthritis 08/27/2017   Permanent atrial  fibrillation (HCC) 06/13/2015   Persistent atrial fibrillation (HCC) 06/13/2015   Preoperative cardiovascular examination 12/30/2018   Rheumatic disease of mitral valve 08/27/2017   Rheumatic heart failure (congestive) (HCC) 08/27/2017   SBO (small bowel obstruction) (HCC)    partial s/p x-lap due to adhesions (Dr Archer) followed by incisional hernia s/p repair.   Sleep apnea    Special screening for malignant neoplasms, colon 12/09/2008   Oct 24, 2009 Entered By: ILAH MORENE ORN Comment: 2 polyps repeat 2013   Status post total right knee replacement 03/08/2021   No Known Allergies  Objective:  Jason Stark is a pleasant 78 y.o. male in NAD. AAO x 3.  Vascular Examination: Patient has palpable DP pulse, absent PT pulse bilateral.  Delayed capillary refill bilateral toes.  Sparse digital hair bilateral.  Proximal to distal cooling WNL bilateral.    Dermatological Examination: Interspaces are clear with no open lesions noted bilateral.  Skin is shiny and atrophic bilateral.  Nails are 3-68mm thick, with yellowish/brown discoloration, subungual debris and distal onycholysis x10.  There is pain with compression of nails x10.  There are hyperkeratotic lesions noted bilateral submetatarsal head 5.  Patient qualifies for at-risk foot care because of PVD.  Assessment/Plan: 1. Pain due to onychomycosis of toenails of both feet   2. Callus of foot   3. PVD (peripheral vascular disease) (HCC)    Mycotic nails x10 were sharply debrided with sterile nail nippers and power debriding burr to decrease  bulk and length.  Hyperkeratotic lesions x 2, bilateral submet 5, were shaved with #312 blade.  Return in about 3 months (around 09/23/2024) for RFC.   Awanda CHARM Imperial, DPM, FACFAS [facility]     [address] [place], [place] [address]                Office [phone]  Fax [phone]

## 2024-06-28 ENCOUNTER — Ambulatory Visit

## 2024-06-28 ENCOUNTER — Ambulatory Visit: Admitting: Cardiology

## 2024-06-28 VITALS — BP 140/86 | HR 68 | Ht 71.0 in | Wt 228.0 lb

## 2024-06-28 DIAGNOSIS — E782 Mixed hyperlipidemia: Secondary | ICD-10-CM | POA: Diagnosis not present

## 2024-06-28 DIAGNOSIS — I4821 Permanent atrial fibrillation: Secondary | ICD-10-CM | POA: Diagnosis not present

## 2024-06-28 DIAGNOSIS — I340 Nonrheumatic mitral (valve) insufficiency: Secondary | ICD-10-CM | POA: Insufficient documentation

## 2024-06-28 DIAGNOSIS — I5032 Chronic diastolic (congestive) heart failure: Secondary | ICD-10-CM | POA: Diagnosis not present

## 2024-06-28 DIAGNOSIS — I10 Essential (primary) hypertension: Secondary | ICD-10-CM | POA: Diagnosis not present

## 2024-06-28 DIAGNOSIS — I2510 Atherosclerotic heart disease of native coronary artery without angina pectoris: Secondary | ICD-10-CM | POA: Diagnosis not present

## 2024-06-28 MED ORDER — POTASSIUM CHLORIDE ER 10 MEQ PO TBCR: 10.0000 meq | EXTENDED_RELEASE_TABLET | Freq: Every day | ORAL | 3 refills | Status: DC

## 2024-06-28 NOTE — Patient Instructions (Signed)
 Medication Instructions:  Your physician has recommended you make the following change in your medication:   START: Potasium chloride 10 meq daily  *If you need a refill on your cardiac medications before your next appointment, please call your pharmacy*  Lab Work: None If you have labs (blood work) drawn today and your tests are completely normal, you will receive your results only by: MyChart Message (if you have MyChart) OR A paper copy in the mail If you have any lab test that is abnormal or we need to change your treatment, we will call you to review the results.  Testing/Procedures: Your physician has requested that you have an echocardiogram. Echocardiography is a painless test that uses sound waves to create images of your heart. It provides your doctor with information about the size and shape of your heart and how well your heart's chambers and valves are working. This procedure takes approximately one hour. There are no restrictions for this procedure. Please do NOT wear cologne, perfume, aftershave, or lotions (deodorant is allowed). Please arrive 15 minutes prior to your appointment time.  Please note: We ask at that you not bring children with you during ultrasound (echo/ vascular) testing. Due to room size and safety concerns, children are not allowed in the ultrasound rooms during exams. Our front office staff cannot provide observation of children in our lobby area while testing is being conducted. An adult accompanying a patient to their appointment will only be allowed in the ultrasound room at the discretion of the ultrasound technician under special circumstances. We apologize for any inconvenience.   Follow-Up: At [facility], you and your health needs are our priority.  As part of our continuing mission to provide you with exceptional heart care, our providers are all part of one team.  This team includes your primary Cardiologist (physician) and Advanced  Practice Providers or APPs (Physician Assistants and Nurse Practitioners) who all work together to provide you with the care you need, when you need it.  Your next appointment:   6 month(s)  Provider:   Alean Kobus, MD    We recommend signing up for the patient portal called MyChart.  Sign up information is provided on this After Visit Summary.  MyChart is used to connect with patients for Virtual Visits (Telemedicine).  Patients are able to view lab/test results, encounter notes, upcoming appointments, etc.  Non-urgent messages can be sent to your provider as well.   To learn more about what you can do with MyChart, go to [email or web address].   Other Instructions 2 gm of sodium/day  Low-Sodium Eating Plan Salt (sodium) helps you keep a healthy balance of fluids in your body. Too much sodium can raise your blood pressure. It can also cause fluid and waste to be held in your body. Your health care provider or dietitian may recommend a low-sodium eating plan if you have high blood pressure (hypertension), kidney disease, liver disease, or heart failure. Eating less sodium can help lower your blood pressure and reduce swelling. It can also protect your heart, liver, and kidneys. What are tips for following this plan? Reading food labels  Check food labels for the amount of sodium per serving. If you eat more than one serving, you must multiply the listed amount by the number of servings. Choose foods with less than 140 milligrams (mg) of sodium per serving. Avoid foods with 300 mg of sodium or more per serving. Always check how much sodium is in a product, even if  the label says unsalted or no salt added. Shopping  Buy products labeled as low-sodium or no salt added. Buy fresh foods. Avoid canned foods and pre-made or frozen meals. Avoid canned, cured, or processed meats. Buy breads that have less than 80 mg of sodium per slice. Cooking  Eat more home-cooked food.  Try to eat less restaurant, buffet, and fast food. Try not to add salt when you cook. Use salt-free seasonings or herbs instead of table salt or sea salt. Check with your provider or pharmacist before using salt substitutes. Cook with plant-based oils, such as canola, sunflower, or olive oil. Meal planning When eating at a restaurant, ask if your food can be made with less salt or no salt. Avoid dishes labeled as brined, pickled, cured, or smoked. Avoid dishes made with soy sauce, miso, or teriyaki sauce. Avoid foods that have monosodium glutamate (MSG) in them. MSG may be added to some restaurant food, sauces, soups, bouillon, and canned foods. Make meals that can be grilled, baked, poached, roasted, or steamed. These are often made with less sodium. General information Try to limit your sodium intake to 1,500-2,300 mg each day, or the amount told by your provider. What foods should I eat? Fruits Fresh, frozen, or canned fruit. Fruit juice. Vegetables Fresh or frozen vegetables. No salt added canned vegetables. No salt added tomato sauce and paste. Low-sodium or reduced-sodium tomato and vegetable juice. Grains Low-sodium cereals, such as oats, puffed wheat and rice, and shredded wheat. Low-sodium crackers. Unsalted rice. Unsalted pasta. Low-sodium bread. Whole grain breads and whole grain pasta. Meats and other proteins Fresh or frozen meat, poultry, seafood, and fish. These should have no added salt. Low-sodium canned tuna and salmon. Unsalted nuts. Dried peas, beans, and lentils without added salt. Unsalted canned beans. Eggs. Unsalted nut butters. Dairy Milk. Soy milk. Cheese that is naturally low in sodium, such as ricotta cheese, fresh mozzarella, or Swiss cheese. Low-sodium or reduced-sodium cheese. Cream cheese. Yogurt. Seasonings and condiments Fresh and dried herbs and spices. Salt-free seasonings. Low-sodium mustard and ketchup. Sodium-free salad dressing. Sodium-free light  mayonnaise. Fresh or refrigerated horseradish. Lemon juice. Vinegar. Other foods Homemade, reduced-sodium, or low-sodium soups. Unsalted popcorn and pretzels. Low-salt or salt-free chips. The items listed above may not be all the foods and drinks you can have. Talk to a dietitian to learn more. What foods should I avoid? Vegetables Sauerkraut, pickled vegetables, and relishes. Olives. [place] fries. Onion rings. Regular canned vegetables, except low-sodium or reduced-sodium items. Regular canned tomato sauce and paste. Regular tomato and vegetable juice. Frozen vegetables in sauces. Grains Instant hot cereals. Bread stuffing, pancake, and biscuit mixes. Croutons. Seasoned rice or pasta mixes. Noodle soup cups. Boxed or frozen macaroni and cheese. Regular salted crackers. Self-rising flour. Meats and other proteins Meat or fish that is salted, canned, smoked, spiced, or pickled. Precooked or cured meat, such as sausages or meat loaves. [place]. Ham. Pepperoni. Hot dogs. Corned beef. Chipped beef. Salt pork. Jerky. Pickled herring, anchovies, and sardines. Regular canned tuna. Salted nuts. Dairy Processed cheese and cheese spreads. Hard cheeses. Cheese curds. Blue cheese. Feta cheese. String cheese. Regular cottage cheese. Buttermilk. Canned milk. Fats and oils Salted butter. Regular margarine. Ghee. Bacon fat. Seasonings and condiments Onion salt, garlic salt, seasoned salt, table salt, and sea salt. Canned and packaged gravies. Worcestershire sauce. Tartar sauce. Barbecue sauce. Teriyaki sauce. Soy sauce, including reduced-sodium soy sauce. Steak sauce. Fish sauce. Oyster sauce. Cocktail sauce. Horseradish that you find on the shelf. Regular ketchup  and mustard. Meat flavorings and tenderizers. Bouillon cubes. Hot sauce. Pre-made or packaged marinades. Pre-made or packaged taco seasonings. Relishes. Regular salad dressings. Salsa. Other foods Salted popcorn and pretzels. Corn chips and puffs. Potato  and tortilla chips. Canned or dried soups. Pizza. Frozen entrees and pot pies. The items listed above may not be all the foods and drinks you should avoid. Talk to a dietitian to learn more. This information is not intended to replace advice given to you by your health care provider. Make sure you discuss any questions you have with your health care provider. Document Revised: 12/12/2022 Document Reviewed: 12/12/2022 Elsevier Patient Education  2024 [employer].

## 2024-06-28 NOTE — Assessment & Plan Note (Signed)
 Last lipid panel from 06/21/2024 well-controlled with total cholesterol 111, triglycerides 120, HDL 29 and LDL 60. Continue with pravastatin  40 mg and Zetia  10 mg once daily.

## 2024-06-28 NOTE — Assessment & Plan Note (Signed)
 History of s/p CABG. Last cardiac cath April 2017 with patent LIMA-LAD, SVG-PDA. Occluded SVG to OM and diagonal branches.  Underwent PCI of LCx.  Good functional capacity. Remains asymptomatic. Not on aspirin as he continues on edoxaban  60 mg once daily.  Continue with pravastatin  40 mg once daily along with Zetia  10 mg once daily. Unable to tolerate higher dose of statins. History significant for

## 2024-06-28 NOTE — Assessment & Plan Note (Signed)
 No follow-up echocardiogram since 2020 to assess valvular heart function. In the setting of mild MR, mild AI repeat transthoracic echocardiogram.

## 2024-06-28 NOTE — Assessment & Plan Note (Signed)
 Rates well-controlled. Remains on anticoagulation with edoxaban .

## 2024-06-28 NOTE — Assessment & Plan Note (Signed)
 Compensated. Mildly hypervolemic. Advise strict salt restriction to below 2 g/day. Continue torsemide  20 mg once daily, mentions good diuresis with this.  Advised to monitor his weights closely every day. If weight gain over 2 to 3 pounds in a day or 4 to 5 pounds in a week should prompt additional dose of torsemide  in the afternoon.  Continue with amlodipine-benazepril 5 mg - 20 mg that he takes twice daily. Continue with current dose of metoprolol  tartrate 25 mg twice daily.  Will follow-up if renal function remains stable can consider SGLT2 inhibitors.

## 2024-06-28 NOTE — Assessment & Plan Note (Signed)
 Well-controlled. Continue current medications amlodipine-benazepril, metoprolol .

## 2024-06-28 NOTE — Progress Notes (Signed)
 Cardiology Consultation:    Date:  06/28/2024   ID:  Jason Stark, DOB 1946/08/08, MRN [id number]  PCP:  Erick Greig LABOR, NP  Cardiologist:  Alean SAUNDERS Ahmaad Neidhardt, MD   Referring MD: Erick Greig LABOR, NP   Chief Complaint  Patient presents with   Follow-up     ASSESSMENT AND PLAN:   Mr. Cuffe 78 year old male history of permanent atrial fibrillation, chronic diastolic CHF, CAD s/p CABG and recent PCI of LCx April 2017 mild MR, mild AI, hyperlipidemia, intolerant to high intensity statins, hypertension CKD stage II/III. Last echocardiogram January 2020 with moderate LVH, mild AI, MR, TR and ascending aorta size 4.1 cm  Here for follow-up visit.  Problem List Items Addressed This Visit     Coronary atherosclerosis of native coronary artery   History of s/p CABG. Last cardiac cath April 2017 with patent LIMA-LAD, SVG-PDA. Occluded SVG to OM and diagonal branches.  Underwent PCI of LCx.  Good functional capacity. Remains asymptomatic. Not on aspirin as he continues on edoxaban  60 mg once daily.  Continue with pravastatin  40 mg once daily along with Zetia  10 mg once daily. Unable to tolerate higher dose of statins. History significant for      Relevant Orders   ECHOCARDIOGRAM COMPLETE   Permanent atrial fibrillation (HCC)   Rates well-controlled. Remains on anticoagulation with edoxaban .       Relevant Orders   EKG 12-Lead (Completed)   ECHOCARDIOGRAM COMPLETE   Hyperlipidemia   Last lipid panel from 06/21/2024 well-controlled with total cholesterol 111, triglycerides 120, HDL 29 and LDL 60. Continue with pravastatin  40 mg and Zetia  10 mg once daily.      Relevant Orders   ECHOCARDIOGRAM COMPLETE   Chronic diastolic heart failure (HCC) - Primary   Compensated. Mildly hypervolemic. Advise strict salt restriction to below 2 g/day. Continue torsemide  20 mg once daily, mentions good diuresis with this.  Advised to monitor his weights closely every day. If  weight gain over 2 to 3 pounds in a day or 4 to 5 pounds in a week should prompt additional dose of torsemide  in the afternoon.  Continue with amlodipine-benazepril 5 mg - 20 mg that he takes twice daily. Continue with current dose of metoprolol  tartrate 25 mg twice daily.  Will follow-up if renal function remains stable can consider SGLT2 inhibitors.      Relevant Orders   ECHOCARDIOGRAM COMPLETE   Hypertension   Well-controlled. Continue current medications amlodipine-benazepril, metoprolol .       Relevant Orders   ECHOCARDIOGRAM COMPLETE   Mild mitral regurgitation   No follow-up echocardiogram since 2020 to assess valvular heart function. In the setting of mild MR, mild AI repeat transthoracic echocardiogram.      Relevant Orders   ECHOCARDIOGRAM COMPLETE   Return to clinic tentatively in 6 months.   History of Present Illness:    Jason Stark is a 78 y.o. male who is being seen today for follow-up visit. PCP is Moon, Amy A, NP. Last visit with Dr. Monetta was 12/30/2023  Pleasant man here for the visit by himself.  Lives at home by himself.  Mentions keeps himself busy with day-to-day activities.  Has history of permanent atrial fibrillation, chronic diastolic CHF, CAD s/p CABG and recent PCI of LCx April 2017 mild MR, mild AI, hyperlipidemia, intolerant to high intensity statins, hypertension CKD stage II/III. Last echocardiogram January 2020 with moderate LVH, mild AI, MR, TR, ascending aorta size 4.1 cm  Denies any active cardiac symptoms.  Denies any chest pain, palpitations, lightheadedness, dizziness. Does report bilateral lower extremity edema worse towards the end of the day. Mentions he is not very diligent about salt restriction in his diet.  Has not been taking potassium supplements for the last 2 weeks and his recent potassium level was 4.5. Takes torsemide  20 mg once daily.   EKG in the clinic today shows atrial fibrillation with heart rate  68/min, RBBB plus LAFB morphology QRS duration 162 ms similar to prior EKG from October 22, 2017  Blood work from 06/21/2024 hemoglobin 15.4, hematocrit 45.1, WBC 8.2 and platelets 193 BUN 11, creatinine 1.23 and EGFR 61 Sodium 140 and potassium 4.5 Normal transaminases and alkaline phosphatase Total bilirubin elevated 3.  Mentions this has been chronically high and following up with PCP Lipid panel total cholesterol 111, triglycerides 120, HDL 29 and LDL 60. Hemoglobin A1c 6.3.  Past Medical History:  Diagnosis Date   Abnormal myocardial perfusion study 03/11/2016   Overview:  Extensive lateral ischemia , [name] 44% in distribution of SVG to [facility] and M   Abnormal stress test 03/21/2016   Atrial fibrillation (HCC) 02/06/2021   Blood glucose elevated 08/27/2017   Cardiomegaly 08/27/2017   Cataract    Chronic diastolic heart failure (HCC) 06/13/2015   CKD (chronic kidney disease) stage 3, GFR 30-59 ml/min (HCC) 07/15/2019   Coronary atherosclerosis of native coronary artery 06/13/2015   Overview:   Cardiac cath 03/13/16: Diagnostic Summary  Severe native multivessel disease as described below  Patent LIMA to LAD  Patent SVG to PDA  Occluded SVG to OM/OM2 ( jump graft)  Occluded SVG to Diagonal  Normal LV function  Diagnostic Recommendations  PCI to the Proximal LCX  Medical therapy for the other disease  Interventional Summary  Successful PCI to the proximaL LCX with Xience 3.0 x   Dyslipidemia 06/13/2015   Esophageal reflux 08/27/2017   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   High risk medication use 06/13/2015   Overview:  Sotolol   History of colon polyps    Hypertension 12/09/1988   Hypertensive heart disease with heart failure (HCC) 06/13/2015   Kidney stone 02/06/2021   Long term current use of anticoagulant therapy 12/19/2015   Overview:   Edoxaban  started 12/14/15   OSA (obstructive sleep apnea) 08/27/2017   Osteoarthritis 08/27/2017   Permanent atrial fibrillation (HCC) 06/13/2015    Persistent atrial fibrillation (HCC) 06/13/2015   Preoperative cardiovascular examination 12/30/2018   Rheumatic disease of mitral valve 08/27/2017   Rheumatic heart failure (congestive) (HCC) 08/27/2017   SBO (small bowel obstruction) (HCC)    partial s/p x-lap due to adhesions (Dr Archer) followed by incisional hernia s/p repair.   Sleep apnea    Special screening for malignant neoplasms, colon 12/09/2008   Oct 24, 2009 Entered By: ILAH MORENE ORN Comment: 2 polyps repeat 2013   Status post total right knee replacement 03/08/2021    Past Surgical History:  Procedure Laterality Date   A FLUTTER ABLATION     a tach/flutter   CARDIAC SURGERY  2012   CARDIOVERSION  01/19/2016   CATARACT EXTRACTION     CHOLECYSTECTOMY     COLONOSCOPY W/ POLYPECTOMY  05/28/2017   Colonic polyp status post polypectomy. Moderate predominantly sigmoid diverticulosis. Small internal hemorrhoids (likely etiology of heme-positive stools- rule out other causes)   CORONARY ANGIOPLASTY WITH STENT PLACEMENT     CORONARY ARTERY BYPASS GRAFT  2011   with Maze and LAA ligation    CORONARY STENT PLACEMENT  03/2016   ESOPHAGOGASTRODUODENOSCOPY  07/23/2017   Mild gastritis. Small transient hiatal hernia. Otherwise normal EGD   HERNIA REPAIR     KNEE SURGERY     PROSTATE SURGERY     RECTAL SURGERY     TOTAL KNEE ARTHROPLASTY Right 02/2019    Current Medications: Current Meds  Medication Sig   acetaminophen (TYLENOL) 650 MG CR tablet Take 1,300 mg by mouth 2 (two) times daily as needed for pain.   amLODipine-benazepril (LOTREL) 5-20 MG capsule Take 1 capsule by mouth 2 (two) times daily.    Cholecalciferol (VITAMIN D-3) 125 MCG (5000 UT) TABS Take 1 tablet by mouth daily.   Docusate Calcium (STOOL SOFTENER PO) Take 1 capsule by mouth daily as needed (constipation).   ezetimibe  (ZETIA ) 10 MG tablet Take 10 mg by mouth daily.   famotidine (PEPCID) 20 MG tablet Take 20 mg by mouth daily.   ferrous sulfate  325 (65 FE) MG tablet Take 324 mg by mouth daily.   loratadine (CLARITIN) 10 MG tablet Take 10 mg by mouth daily.    metoprolol  tartrate (LOPRESSOR ) 25 MG tablet Take 1 tablet (25 mg total) by mouth daily.   montelukast (SINGULAIR) 10 MG tablet Take 10 mg by mouth daily.   potassium chloride  (KLOR-CON ) 10 MEQ tablet Take 1 tablet (10 mEq total) by mouth daily.   pravastatin  (PRAVACHOL ) 40 MG tablet Take 1 tablet (40 mg total) by mouth daily.   SAVAYSA  60 MG TABS tablet TAKE 1 TABLET DAILY   tamsulosin (FLOMAX) 0.4 MG CAPS capsule Take 0.4 mg by mouth daily.   torsemide  (DEMADEX ) 20 MG tablet Take 20 mg by mouth daily.   [DISCONTINUED] potassium chloride  (KLOR-CON ) 10 MEQ tablet Take 2 tablets (20 mEq total) by mouth daily.     Allergies:   Patient has no known allergies.   Social History   Socioeconomic History   Marital status: Married    Spouse name: Not on file   Number of children: Not on file   Years of education: Not on file   Highest education level: Not on file  Occupational History   Not on file  Tobacco Use   Smoking status: Former    Current packs/day: 0.00    Average packs/day: 0.3 packs/day for 25.0 years (6.3 ttl pk-yrs)    Types: Cigarettes    Start date: 19    Quit date: 2010    Years since quitting: 15.5    Passive exposure: Past   Smokeless tobacco: Former    Types: Chew    Quit date: 2011  Vaping Use   Vaping status: Never Used  Substance and Sexual Activity   Alcohol use: No   Drug use: No   Sexual activity: Not on file  Other Topics Concern   Not on file  Social History Narrative   Not on file   Social Drivers of Health   Financial Resource Strain: Not on file  Food Insecurity: Not on file  Transportation Needs: Not on file  Physical Activity: Not on file  Stress: Not on file  Social Connections: Not on file     Family History: The patient's family history includes Colon polyps in his mother; Diabetes in his mother; Hyperlipidemia in  his brother and mother; Hypertension in his brother and father. There is no history of Colon cancer, Esophageal cancer, or Stomach cancer. ROS:   Please see the history of present illness.    All 14 point review of systems negative except as described per history of present  illness.  EKGs/Labs/Other Studies Reviewed:    The following studies were reviewed today:   EKG:  EKG Interpretation Date/Time:  Monday June 28 2024 16:12:41 EDT Ventricular Rate:  68 PR Interval:    QRS Duration:  162 QT Interval:  472 QTC Calculation: 501 R Axis:   -39  Text Interpretation: Atrial fibrillation Left axis deviation Right bundle branch block Minimal voltage criteria for LVH, may be normal variant ( R in aVL ) When compared with ECG of 22-Oct-2017 11:44, No significant change was found Confirmed by Liborio Hai reddy [phone]) on 06/28/2024 4:27:53 PM    Recent Labs: No results found for requested labs within last 365 days.  Recent Lipid Panel    Component Value Date/Time   CHOL 107 07/15/2019 1149   TRIG 146 07/15/2019 1149   HDL 26 (L) 07/15/2019 1149   CHOLHDL 4.1 07/15/2019 1149   LDLCALC 52 07/15/2019 1149    Physical Exam:    VS:  BP (!) 140/86   Pulse 68   Ht 5' 11 (1.803 m)   Wt 228 lb (103.4 kg)   SpO2 97%   BMI 31.80 kg/m     Wt Readings from Last 3 Encounters:  06/28/24 228 lb (103.4 kg)  12/30/23 236 lb 3.2 oz (107.1 kg)  05/16/23 232 lb 9.6 oz (105.5 kg)     GENERAL:  Well nourished, well developed in no acute distress NECK: No JVD; No carotid bruits CARDIAC: RRR, S1 and S2 present, no murmurs, no rubs, no gallops CHEST:  Clear to auscultation without rales, wheezing or rhonchi  Extremities: 1+ bilateral pitting pedal edema, pulses bilaterally symmetric with radial 2+ and dorsalis pedis 2+ NEUROLOGIC:  Alert and oriented x 3  Medication Adjustments/Labs and Tests Ordered: Current medicines are reviewed at length with the patient today.  Concerns regarding  medicines are outlined above.  Orders Placed This Encounter  Procedures   EKG 12-Lead   ECHOCARDIOGRAM COMPLETE   Meds ordered this encounter  Medications   potassium chloride  (KLOR-CON ) 10 MEQ tablet    Sig: Take 1 tablet (10 mEq total) by mouth daily.    Dispense:  90 tablet    Refill:  3    Signed, Lilyannah Zuelke reddy Marques Ericson, MD, MPH, [facility] 06/28/2024 4:51 PM    [facility]

## 2024-07-15 ENCOUNTER — Ambulatory Visit

## 2024-07-15 DIAGNOSIS — I2510 Atherosclerotic heart disease of native coronary artery without angina pectoris: Secondary | ICD-10-CM | POA: Diagnosis not present

## 2024-07-15 DIAGNOSIS — I4821 Permanent atrial fibrillation: Secondary | ICD-10-CM | POA: Insufficient documentation

## 2024-07-15 DIAGNOSIS — I5032 Chronic diastolic (congestive) heart failure: Secondary | ICD-10-CM | POA: Insufficient documentation

## 2024-07-15 DIAGNOSIS — I340 Nonrheumatic mitral (valve) insufficiency: Secondary | ICD-10-CM | POA: Insufficient documentation

## 2024-07-15 DIAGNOSIS — I10 Essential (primary) hypertension: Secondary | ICD-10-CM | POA: Insufficient documentation

## 2024-07-15 DIAGNOSIS — E782 Mixed hyperlipidemia: Secondary | ICD-10-CM | POA: Insufficient documentation

## 2024-07-15 LAB — ECHOCARDIOGRAM COMPLETE
AR max vel: 2.37 cm2
AV Area VTI: 2.53 cm2
AV Area mean vel: 2.35 cm2
AV Mean grad: 5 mmHg
AV Peak grad: 10.2 mmHg
AV Vena cont: 0.3 cm
Ao pk vel: 1.59 m/s
Area-P 1/2: 6.27 cm2
MV M vel: 5.06 m/s
MV Peak grad: 102.4 mmHg
P 1/2 time: 800 ms
Radius: 0.2 cm
S' Lateral: 4.5 cm

## 2024-07-16 ENCOUNTER — Ambulatory Visit: Payer: Self-pay

## 2024-08-18 DIAGNOSIS — R918 Other nonspecific abnormal finding of lung field: Secondary | ICD-10-CM | POA: Diagnosis not present

## 2024-08-18 DIAGNOSIS — J439 Emphysema, unspecified: Secondary | ICD-10-CM | POA: Diagnosis not present

## 2024-09-08 ENCOUNTER — Telehealth: Payer: Self-pay

## 2024-09-08 MED ORDER — POTASSIUM CHLORIDE ER 10 MEQ PO TBCR: 10.0000 meq | EXTENDED_RELEASE_TABLET | Freq: Every day | ORAL | 2 refills | Status: AC

## 2024-09-08 NOTE — Telephone Encounter (Signed)
 Pt's medication was sent to pt's pharmacy as requested. Confirmation received.

## 2024-09-08 NOTE — Telephone Encounter (Signed)
*  STAT* If patient is at the pharmacy, call can be transferred to refill team.   1. Which medications need to be refilled? (please list name of each medication and dose if known) potassium chloride  (KLOR-CON ) 10 MEQ tablet    2. Would you like to learn more about the convenience, safety, & potential cost savings by using the [facility]? No   3. Are you open to using the [facility] (Type [facility] ) No   4. Which pharmacy/location (including street and city if local pharmacy) is medication to be sent to? No   5. Do they need a 30 day or 90 day supply? 90 day

## 2024-09-22 ENCOUNTER — Ambulatory Visit: Admitting: Podiatry

## 2024-09-22 DIAGNOSIS — I739 Peripheral vascular disease, unspecified: Secondary | ICD-10-CM | POA: Diagnosis not present

## 2024-09-22 DIAGNOSIS — B351 Tinea unguium: Secondary | ICD-10-CM

## 2024-09-22 DIAGNOSIS — M79674 Pain in right toe(s): Secondary | ICD-10-CM

## 2024-09-22 DIAGNOSIS — G609 Hereditary and idiopathic neuropathy, unspecified: Secondary | ICD-10-CM | POA: Diagnosis not present

## 2024-09-22 DIAGNOSIS — M79675 Pain in left toe(s): Secondary | ICD-10-CM

## 2024-09-22 DIAGNOSIS — L84 Corns and callosities: Secondary | ICD-10-CM

## 2024-09-22 MED ORDER — GABAPENTIN 100 MG PO CAPS: 100.0000 mg | ORAL_CAPSULE | Freq: Two times a day (BID) | ORAL | 0 refills | Status: DC

## 2024-09-22 NOTE — Progress Notes (Signed)
 Subjective:  Patient ID: Jason Stark, male    DOB: 07/19/1946,  MRN: [id number]  Jason Stark presents to clinic today for:  Chief Complaint  Patient presents with   [facility]    RFC with callous, soaking Not diabetic and no anti coag.    Patient notes nails are thick and elongated, causing pain in shoe gear when ambulating.  Also has painful bilateral submet 5 calluses.  He notes that he is having some tingling and burning in his feet, which occurs mostly when he is on his feet.  It is not worse at night.  Denies any recent back injury or disc herniation.  PCP is Moon, Amy A, NP.  Last seen 06/21/2024  Past Medical History:  Diagnosis Date   Abnormal myocardial perfusion study 03/11/2016   Overview:  Extensive lateral ischemia , [name] 44% in distribution of SVG to [facility] and M   Abnormal stress test 03/21/2016   Atrial fibrillation (HCC) 02/06/2021   Blood glucose elevated 08/27/2017   Cardiomegaly 08/27/2017   Cataract    Chronic diastolic heart failure (HCC) 06/13/2015   CKD (chronic kidney disease) stage 3, GFR 30-59 ml/min (HCC) 07/15/2019   Coronary atherosclerosis of native coronary artery 06/13/2015   Overview:   Cardiac cath 03/13/16: Diagnostic Summary  Severe native multivessel disease as described below  Patent LIMA to LAD  Patent SVG to PDA  Occluded SVG to OM/OM2 ( jump graft)  Occluded SVG to Diagonal  Normal LV function  Diagnostic Recommendations  PCI to the Proximal LCX  Medical therapy for the other disease  Interventional Summary  Successful PCI to the proximaL LCX with Xience 3.0 x   Dyslipidemia 06/13/2015   Esophageal reflux 08/27/2017   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   High risk medication use 06/13/2015   Overview:  Sotolol   History of colon polyps    Hypertension 12/09/1988   Hypertensive heart disease with heart failure (HCC) 06/13/2015   Kidney stone 02/06/2021   Long term current use of anticoagulant therapy 12/19/2015   Overview:   Edoxaban   started 12/14/15   OSA (obstructive sleep apnea) 08/27/2017   Osteoarthritis 08/27/2017   Permanent atrial fibrillation (HCC) 06/13/2015   Persistent atrial fibrillation (HCC) 06/13/2015   Preoperative cardiovascular examination 12/30/2018   Rheumatic disease of mitral valve 08/27/2017   Rheumatic heart failure (congestive) (HCC) 08/27/2017   SBO (small bowel obstruction) (HCC)    partial s/p x-lap due to adhesions (Dr Archer) followed by incisional hernia s/p repair.   Sleep apnea    Special screening for malignant neoplasms, colon 12/09/2008   Oct 24, 2009 Entered By: ILAH MORENE ORN Comment: 2 polyps repeat 2013   Status post total right knee replacement 03/08/2021   No Known Allergies  Objective:  Jason Stark is a pleasant 78 y.o. male in NAD. AAO x 3.  Vascular Examination: Patient has palpable DP pulse, absent PT pulse bilateral.  Delayed capillary refill bilateral toes.  Sparse digital hair bilateral.  Proximal to distal cooling WNL bilateral.    Dermatological Examination: Interspaces are clear with no open lesions noted bilateral.  Skin is shiny and atrophic bilateral.  Nails are 3-87mm thick, with yellowish/brown discoloration, subungual debris and distal onycholysis x10.  There is pain with compression of nails x10.  There are hyperkeratotic lesions noted bilateral submet 5.  Neurological examination: Protective sensation intact using a Semmes Weinstein monofilament.  Manual muscle testing 5/5 bilateral.  Patient qualifies for at-risk foot care because of PVD.  Assessment/Plan: 1. Pain  due to onychomycosis of toenails of both feet   2. Callus of foot   3. PVD (peripheral vascular disease)   4. Idiopathic peripheral neuropathy     Meds ordered this encounter  Medications   gabapentin  (NEURONTIN ) 100 MG capsule    Sig: Take 1 capsule (100 mg total) by mouth 2 (two) times daily.    Dispense:  60 capsule    Refill:  0   Mycotic nails x10 were sharply  debrided with sterile nail nippers and power debriding burr to decrease bulk and length.  Hyperkeratotic lesions bilateral submet 5 were shaved with #312 blade.  Will send in 100 mg gabapentin , twice daily for 30 days and see how he responds.  He may adjust the dose as needed.  He will call when he starts to run out of the prescription and see if we need to adjust the dose or continue at the current dose.  Return in about 3 months (around 12/23/2024) for RFC.   Awanda CHARM Imperial, DPM, FACFAS [facility]     [address] [place], [place] [address]                Office [phone]  Fax [phone]

## 2024-09-29 ENCOUNTER — Telehealth: Payer: Self-pay | Admitting: Lab

## 2024-09-29 NOTE — Telephone Encounter (Addendum)
 Patient states was give medication at his last visit causing him severe diarrhea and states provider stated she would call him in some cream if that didn't work please advise. Was prescribed gabapentin  (NEURONTIN ) 100 MG capsule .

## 2024-09-30 ENCOUNTER — Telehealth: Payer: Self-pay

## 2024-09-30 ENCOUNTER — Other Ambulatory Visit: Payer: Self-pay | Admitting: Podiatry

## 2024-09-30 MED ORDER — NONFORMULARY OR COMPOUNDED ITEM: 1.0000 | Freq: Three times a day (TID) | 4 refills | Status: AC

## 2024-09-30 MED ORDER — METOPROLOL TARTRATE 25 MG PO TABS: 25.0000 mg | ORAL_TABLET | Freq: Every day | ORAL | 2 refills | Status: AC

## 2024-09-30 NOTE — Telephone Encounter (Signed)
 Rx sent in

## 2024-09-30 NOTE — Telephone Encounter (Signed)
 Pt c/o medication issue:  1. Name of Medication:  Potassium   2. How are you currently taking this medication (dosage and times per day)?   3. Are you having a reaction (difficulty breathing--STAT)?   4. What is your medication issue?  Patient would like to know if he should still be taking Potassium. Please advise.

## 2024-09-30 NOTE — Progress Notes (Signed)
 Custom compounded medication sent to [facility] for this patient.  He did not tolerate the gabapentin  well stating that it was giving him significant diarrhea and called him to request a topical.  This is for his neuropathy

## 2024-09-30 NOTE — Telephone Encounter (Signed)
*  STAT* If patient is at the pharmacy, call can be transferred to refill team.   1. Which medications need to be refilled? (please list name of each medication and dose if known)  metoprolol tartrate (LOPRESSOR) 25 MG tablet  2. Which pharmacy/location (including street and city if local pharmacy) is medication to be sent to? [facility] - [place], [place] - [address]  3. Do they need a 30 day or 90 day supply?  90 day supply  

## 2024-09-30 NOTE — Telephone Encounter (Signed)
 Left message for pt, yes he is to continue potassium and a refill was sent to the pharmacy 09/08/24. He is to call with questions.

## 2024-09-30 NOTE — Telephone Encounter (Signed)
 Patient is returning call.

## 2024-09-30 NOTE — Telephone Encounter (Signed)
 Advised that RX was sent to [facility] on [address] [employer]. Pt verbalized understanding and had no additional questions.

## 2024-10-01 DIAGNOSIS — R351 Nocturia: Secondary | ICD-10-CM | POA: Diagnosis not present

## 2024-10-01 DIAGNOSIS — R3129 Other microscopic hematuria: Secondary | ICD-10-CM | POA: Diagnosis not present

## 2024-10-01 DIAGNOSIS — N401 Enlarged prostate with lower urinary tract symptoms: Secondary | ICD-10-CM | POA: Diagnosis not present

## 2024-10-18 DIAGNOSIS — J449 Chronic obstructive pulmonary disease, unspecified: Secondary | ICD-10-CM | POA: Diagnosis not present

## 2024-10-18 DIAGNOSIS — N182 Chronic kidney disease, stage 2 (mild): Secondary | ICD-10-CM | POA: Diagnosis not present

## 2024-10-18 DIAGNOSIS — I482 Chronic atrial fibrillation, unspecified: Secondary | ICD-10-CM | POA: Diagnosis not present

## 2024-10-18 DIAGNOSIS — Z6833 Body mass index (BMI) 33.0-33.9, adult: Secondary | ICD-10-CM | POA: Diagnosis not present

## 2024-10-18 DIAGNOSIS — Z2821 Immunization not carried out because of patient refusal: Secondary | ICD-10-CM | POA: Diagnosis not present

## 2024-10-18 DIAGNOSIS — D649 Anemia, unspecified: Secondary | ICD-10-CM | POA: Diagnosis not present

## 2024-10-18 DIAGNOSIS — I700 Atherosclerosis of aorta: Secondary | ICD-10-CM | POA: Diagnosis not present

## 2024-10-18 DIAGNOSIS — E1169 Type 2 diabetes mellitus with other specified complication: Secondary | ICD-10-CM | POA: Diagnosis not present

## 2024-10-18 DIAGNOSIS — I5032 Chronic diastolic (congestive) heart failure: Secondary | ICD-10-CM | POA: Diagnosis not present

## 2024-10-18 DIAGNOSIS — G4733 Obstructive sleep apnea (adult) (pediatric): Secondary | ICD-10-CM | POA: Diagnosis not present

## 2024-10-18 DIAGNOSIS — I130 Hypertensive heart and chronic kidney disease with heart failure and stage 1 through stage 4 chronic kidney disease, or unspecified chronic kidney disease: Secondary | ICD-10-CM | POA: Diagnosis not present

## 2024-10-18 DIAGNOSIS — E785 Hyperlipidemia, unspecified: Secondary | ICD-10-CM | POA: Diagnosis not present

## 2024-10-28 ENCOUNTER — Other Ambulatory Visit: Payer: Self-pay | Admitting: Cardiology

## 2024-11-19 ENCOUNTER — Telehealth: Payer: Self-pay

## 2024-11-19 ENCOUNTER — Other Ambulatory Visit (INDEPENDENT_AMBULATORY_CARE_PROVIDER_SITE_OTHER)

## 2024-11-19 ENCOUNTER — Encounter: Payer: Self-pay | Admitting: Gastroenterology

## 2024-11-19 ENCOUNTER — Ambulatory Visit: Admitting: Gastroenterology

## 2024-11-19 VITALS — BP 130/60 | HR 62 | Ht 69.5 in | Wt 232.2 lb

## 2024-11-19 DIAGNOSIS — K648 Other hemorrhoids: Secondary | ICD-10-CM

## 2024-11-19 DIAGNOSIS — D509 Iron deficiency anemia, unspecified: Secondary | ICD-10-CM | POA: Diagnosis not present

## 2024-11-19 DIAGNOSIS — K625 Hemorrhage of anus and rectum: Secondary | ICD-10-CM | POA: Diagnosis not present

## 2024-11-19 DIAGNOSIS — Z860100 Personal history of colon polyps, unspecified: Secondary | ICD-10-CM | POA: Diagnosis not present

## 2024-11-19 LAB — CBC WITH DIFFERENTIAL/PLATELET
Basophils Absolute: 0.1 K/uL (ref 0.0–0.1)
Basophils Relative: 1.2 % (ref 0.0–3.0)
Eosinophils Absolute: 0.1 K/uL (ref 0.0–0.7)
Eosinophils Relative: 1.1 % (ref 0.0–5.0)
HCT: 42.3 % (ref 39.0–52.0)
Hemoglobin: 15.1 g/dL (ref 13.0–17.0)
Lymphocytes Relative: 18.6 % (ref 12.0–46.0)
Lymphs Abs: 1.6 K/uL (ref 0.7–4.0)
MCHC: 35.8 g/dL (ref 30.0–36.0)
MCV: 92.4 fl (ref 78.0–100.0)
Monocytes Absolute: 0.7 K/uL (ref 0.1–1.0)
Monocytes Relative: 8.4 % (ref 3.0–12.0)
Neutro Abs: 6.2 K/uL (ref 1.4–7.7)
Neutrophils Relative %: 70.7 % (ref 43.0–77.0)
Platelets: 195 K/uL (ref 150.0–400.0)
RBC: 4.57 Mil/uL (ref 4.22–5.81)
RDW: 13.5 % (ref 11.5–15.5)
WBC: 8.8 K/uL (ref 4.0–10.5)

## 2024-11-19 LAB — IBC + FERRITIN
Ferritin: 157.7 ng/mL (ref 22.0–322.0)
Iron: 101 ug/dL (ref 42–165)
Saturation Ratios: 28.7 % (ref 20.0–50.0)
TIBC: 351.4 ug/dL (ref 250.0–450.0)
Transferrin: 251 mg/dL (ref 212.0–360.0)

## 2024-11-19 MED ORDER — HYDROCORTISONE ACETATE 25 MG RE SUPP: 25.0000 mg | Freq: Every evening | RECTAL | 0 refills | Status: DC

## 2024-11-19 MED ORDER — NA SULFATE-K SULFATE-MG SULF 17.5-3.13-1.6 GM/177ML PO SOLN: 1.0000 | Freq: Once | ORAL | 0 refills | Status: AC

## 2024-11-19 NOTE — Progress Notes (Signed)
 ANOSCOPY PROCEDURE REPORT:     INDICATION: Rectal bleeding     The nature of anoscopy procedure and rationale for use was explained to the patient and they understood and agreed to proceed.     The lubricated lighted anoscope was inserted with the patient in the left lateral decubitus position and the anus and distal rectum was examined.     FINDINGS: External skin tag noted, intern hemorrhoids grade 2 noted  Chaperone Karna

## 2024-11-19 NOTE — Telephone Encounter (Signed)
 Pharmacy please advise on holding Edoxaban  for 2 days prior to endoscopy scheduled for 01/07/2024. Labs 10/18/2024. Thank you.

## 2024-11-19 NOTE — Telephone Encounter (Signed)
 [facility] Pre-operative Risk Assessment     Request for surgical clearance:     Endoscopy Procedure  What type of surgery is being performed?     Colonoscopy  When is this surgery scheduled?     01/06/25  What type of clearance is required ?   Pharmacy  Are there any medications that need to be held prior to surgery and how long? Edoxaban  2 days  Practice name and name of physician performing surgery?      [facility]  What is your office phone and fax number?      Phone- [phone]  Fax- [phone]  Anesthesia type (None, local, MAC, general) ?       MAC   Please route your response to Karna Louder, CMA

## 2024-11-19 NOTE — Patient Instructions (Addendum)
 Your provider has requested that you go to the basement level for lab work before leaving today. Press B on the elevator. The lab is located at the first door on the left as you exit the elevator.  Gas/bloat Ibgard 2 capsule with meals Recommend low fodmap diet  Hemorrhoids No straining or pushing 1 Suppository at bedtime, can use lubricate to insert Sent in prescription, can use samples first  Constipation Start OTC Miralax po daily Continue metamucil po daily   We have sent the following medications to your pharmacy for you to pick up at your convenience: Hydrocortisone Suppositories 25 mg at bedtime and SUPREP following instruction given to you today at your visit  You have been scheduled for a colonoscopy. Please follow written instructions given to you at your visit today.   If you use inhalers (even only as needed), please bring them with you on the day of your procedure.  DO NOT TAKE 7 DAYS PRIOR TO TEST- Trulicity (dulaglutide) Ozempic, Wegovy (semaglutide) Mounjaro, Zepbound (tirzepatide) Bydureon Bcise (exanatide extended release)  DO NOT TAKE 1 DAY PRIOR TO YOUR TEST Rybelsus (semaglutide) Adlyxin (lixisenatide) Victoza (liraglutide) Byetta (exanatide) ___________________________________________________________________________  Due to recent changes in healthcare laws, you may see the results of your imaging and laboratory studies on MyChart before your provider has had a chance to review them.  We understand that in some cases there may be results that are confusing or concerning to you. Not all laboratory results come back in the same time frame and the provider may be waiting for multiple results in order to interpret others.  Please give us  48 hours in order for your provider to thoroughly review all the results before contacting the office for clarification of your results.   _______________________________________________________  If your blood pressure at  your visit was 140/90 or greater, please contact your primary care physician to follow up on this.  _______________________________________________________  If you are age 37 or older, your body mass index should be between 23-30. Your Body mass index is 33.81 kg/m. If this is out of the aforementioned range listed, please consider follow up with your Primary Care Provider.  If you are age 34 or younger, your body mass index should be between 19-25. Your Body mass index is 33.81 kg/m. If this is out of the aformentioned range listed, please consider follow up with your Primary Care Provider.   ________________________________________________________  The [facility] providers would like to encourage you to use MYCHART to communicate with providers for non-urgent requests or questions.  Due to long hold times on the telephone, sending your provider a message by [facility] may be a faster and more efficient way to get a response.  Please allow 48 business hours for a response.  Please remember that this is for non-urgent requests.  _______________________________________________________  [facility] is using a team-based approach to care.  Your team is made up of your doctor and two to three APPS. Our APPS (Nurse Practitioners and Physician Assistants) work with your physician to ensure care continuity for you. They are fully qualified to address your health concerns and develop a treatment plan. They communicate directly with your gastroenterologist to care for you. Seeing the Advanced Practice Practitioners on your physician's team can help you by facilitating care more promptly, often allowing for earlier appointments, access to diagnostic testing, procedures, and other specialty referrals.   Thank you for trusting me with your gastrointestinal care. Deanna May, FNP-C

## 2024-11-19 NOTE — Telephone Encounter (Signed)
 Spoke with pt and wife was told to hold his Edoxaban  starting 1/27 for 2 days.

## 2024-11-19 NOTE — Progress Notes (Signed)
 Chief Complaint:rectal bleeding, gas  Primary GI Doctor:Dr. Charlanne  HPI: Jason Stark is a 78 y.o. male  With history of CAD s/p CABG, A. fib on savasya, HTN, dCHF (EF 50- 55%), CKD3, OSA, HLD  Interval History Patient last seen in GI office on 10/19/20 by Dr. Charlanne for hx of rectal bleeding/IDA.  Patient presents for evaluation of rectal bleeding and gas.  Accompanied by his daughter.  Patient has had intermittent rectal bleeding for past month or more. He admits to straining and pushing with BM. He has had some issues with constipation. Patient currently taking daily Metamucil po daily.  He has been using OTC Tucks for suspected hemorrhoids which helped some.  Denies abdominal pain.  He has history of Iron deficiency and taking oral iron supplement 1 tablet po daily.   Patient on edoxaban  60 mg po daily.   Denies dizziness or lightheadedness.   Patient overdue for colonoscopy, has been under a lot of stress past year. He lost his wife last September 2024 and mother in law shortly after that.   Previous GI work-up: -CT Abdo/pelvis 06/28/2020 - for any acute abnormalities except for fatty liver, atherosclerosis. Performed at [facility]. I have reviewed the films. -EGD 07/23/2017: Mild gastritis, small transient HH. Has positive CLOtest, treated with triple drug therapy. -Colonoscopy 05/28/2017 ([facility]): Colonic polyp s/p polypectomy, moderate predominantly sigmoid diverticulosis, small internal hemorrhoids. Bx-tubular adenoma.  -colonoscopy 12/28/2020: Colonic polyps s/ p polypectomy. Moderate predominantly sigmoid diverticulosis. Non- bleeding internal hemorrhoids. The examined portion of the ileum was normal. The examination was otherwise normal on direct and retroflexion views.  3 year recall  Wt Readings from Last 3 Encounters:  11/19/24 232 lb 4 oz (105.3 kg)  06/28/24 228 lb (103.4 kg)  12/30/23 236 lb 3.2 oz (107.1 kg)    Past Medical History:  Diagnosis  Date   Abnormal myocardial perfusion study 03/11/2016   Overview:  Extensive lateral ischemia , [name] 44% in distribution of SVG to [facility] and M   Abnormal stress test 03/21/2016   Atrial fibrillation (HCC) 02/06/2021   Blood glucose elevated 08/27/2017   Cardiomegaly 08/27/2017   Cataract    Chronic diastolic heart failure (HCC) 06/13/2015   CKD (chronic kidney disease) stage 3, GFR 30-59 ml/min (HCC) 07/15/2019   COPD (chronic obstructive pulmonary disease) (HCC)    Coronary atherosclerosis of native coronary artery 06/13/2015   Overview:   Cardiac cath 03/13/16: Diagnostic Summary  Severe native multivessel disease as described below  Patent LIMA to LAD  Patent SVG to PDA  Occluded SVG to OM/OM2 ( jump graft)  Occluded SVG to Diagonal  Normal LV function  Diagnostic Recommendations  PCI to the Proximal LCX  Medical therapy for the other disease  Interventional Summary  Successful PCI to the proximaL LCX with Xience 3.0 x   Dyslipidemia 06/13/2015   Esophageal reflux 08/27/2017   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   High risk medication use 06/13/2015   Overview:  Sotolol   History of colon polyps    Hypertension 12/09/1988   Hypertensive heart disease with heart failure (HCC) 06/13/2015   Kidney stone 02/06/2021   Long term current use of anticoagulant therapy 12/19/2015   Overview:   Edoxaban  started 12/14/15   OSA (obstructive sleep apnea) 08/27/2017   Osteoarthritis 08/27/2017   Permanent atrial fibrillation (HCC) 06/13/2015   Persistent atrial fibrillation (HCC) 06/13/2015   Preoperative cardiovascular examination 12/30/2018   Rheumatic disease of mitral valve 08/27/2017   Rheumatic heart failure (congestive) (HCC)  08/27/2017   SBO (small bowel obstruction) (HCC)    partial s/p x-lap due to adhesions (Dr Archer) followed by incisional hernia s/p repair.   Sleep apnea    Special screening for malignant neoplasms, colon 12/09/2008   Oct 24, 2009 Entered By: ILAH MORENE ORN Comment:  2 polyps repeat 2013   Status post total right knee replacement 03/08/2021    Past Surgical History:  Procedure Laterality Date   A FLUTTER ABLATION     a tach/flutter   CARDIAC SURGERY  2012   CARDIOVERSION  01/19/2016   CATARACT EXTRACTION     CHOLECYSTECTOMY     COLONOSCOPY W/ POLYPECTOMY  05/28/2017   Colonic polyp status post polypectomy. Moderate predominantly sigmoid diverticulosis. Small internal hemorrhoids (likely etiology of heme-positive stools- rule out other causes)   CORONARY ANGIOPLASTY WITH STENT PLACEMENT     CORONARY ARTERY BYPASS GRAFT  2011   with Maze and LAA ligation    CORONARY STENT PLACEMENT  03/2016   ESOPHAGOGASTRODUODENOSCOPY  07/23/2017   Mild gastritis. Small transient hiatal hernia. Otherwise normal EGD   HERNIA REPAIR Left    side   INGUINAL HERNIA REPAIR Right    KNEE SURGERY     PROSTATE SURGERY     RECTAL SURGERY     TOTAL KNEE ARTHROPLASTY Right 02/2019    Current Outpatient Medications  Medication Sig Dispense Refill   acetaminophen (TYLENOL) 650 MG CR tablet Take 1,300 mg by mouth 2 (two) times daily as needed for pain.     amLODipine-benazepril (LOTREL) 5-20 MG capsule Take 1 capsule by mouth 2 (two) times daily.      Cholecalciferol (VITAMIN D-3) 125 MCG (5000 UT) TABS Take 1 tablet by mouth daily.     Docusate Calcium (STOOL SOFTENER PO) Take 1 capsule by mouth daily as needed (constipation).     edoxaban  (SAVAYSA ) 60 MG TABS tablet TAKE 1 TABLET DAILY 90 tablet 2   ezetimibe  (ZETIA ) 10 MG tablet Take 10 mg by mouth daily.     famotidine (PEPCID) 20 MG tablet Take 20 mg by mouth daily.     ferrous sulfate 325 (65 FE) MG tablet Take 324 mg by mouth daily.     gabapentin  (NEURONTIN ) 100 MG capsule Take 1 capsule (100 mg total) by mouth 2 (two) times daily. 60 capsule 0   hydrocortisone (ANUSOL-HC) 25 MG suppository Place 1 suppository (25 mg total) rectally at bedtime. 10 suppository 0   loratadine (CLARITIN) 10 MG tablet Take 10 mg by  mouth daily.      metoprolol  tartrate (LOPRESSOR ) 25 MG tablet Take 1 tablet (25 mg total) by mouth daily. 90 tablet 2   montelukast (SINGULAIR) 10 MG tablet Take 10 mg by mouth daily.     Na Sulfate-K Sulfate-Mg Sulfate concentrate (SUPREP) 17.5-3.13-1.6 GM/177ML SOLN Take 1 kit (354 mLs total) by mouth once for 1 dose. 354 mL 0   NONFORMULARY OR COMPOUNDED ITEM Apply 1 Application topically 3 (three) times daily. 60 each 4   potassium chloride  (KLOR-CON ) 10 MEQ tablet Take 1 tablet (10 mEq total) by mouth daily. 90 tablet 2   pravastatin  (PRAVACHOL ) 40 MG tablet Take 1 tablet (40 mg total) by mouth daily. 90 tablet 3   tamsulosin (FLOMAX) 0.4 MG CAPS capsule Take 0.4 mg by mouth daily.     torsemide  (DEMADEX ) 20 MG tablet Take 20 mg by mouth daily.     No current facility-administered medications for this visit.    Allergies as of 11/19/2024 - Review  Complete 11/19/2024  Allergen Reaction Noted   Kenalog -10 [triamcinolone  acetonide] Nausea And Vomiting 11/19/2024    Family History  Problem Relation Age of Onset   Diabetes Mother    Hyperlipidemia Mother    Colon polyps Mother    Stroke Father    Heart attack Sister    Diabetes Sister    Cancer Sister    Heart attack Sister    Hypertension Brother    Hyperlipidemia Brother    Stroke Paternal Grandmother    Hypertension Son    Hypertension Daughter    Colon cancer Neg Hx    Esophageal cancer Neg Hx    Stomach cancer Neg Hx     Review of Systems:    Constitutional: No weight loss, fever, chills, weakness or fatigue HEENT: Eyes: No change in vision               Ears, Nose, Throat:  No change in hearing or congestion Skin: No rash or itching Cardiovascular: No chest pain, chest pressure or palpitations   Respiratory: No SOB or cough Gastrointestinal: See HPI and otherwise negative Genitourinary: No dysuria or change in urinary frequency Neurological: No headache, dizziness or syncope Musculoskeletal: No new muscle or  joint pain Hematologic: No bleeding or bruising Psychiatric: No history of depression or anxiety    Physical Exam:  Vital signs: BP 130/60 (BP Location: Left Arm, Patient Position: Sitting, Cuff Size: Large)   Pulse 62   Ht 5' 9.5 (1.765 m) Comment: height measured without shoes  Wt 232 lb 4 oz (105.3 kg)   BMI 33.81 kg/m   Constitutional:   Pleasant male appears to be in NAD, Well developed, Well nourished, alert and cooperative Eyes:   PEERL, EOMI. No icterus. Conjunctiva pink. Neck:  Supple Throat: Oral cavity and pharynx without inflammation, swelling or lesion.  Respiratory: Respirations even and unlabored. Lungs clear to auscultation bilaterally.   No wheezes, crackles, or rhonchi.  Cardiovascular: Normal S1, S2. Regular rate and rhythm. No peripheral edema, cyanosis or pallor.  Gastrointestinal:  Soft, nondistended, nontender. No rebound or guarding. Normal bowel sounds. No appreciable masses or hepatomegaly. Rectal: external skin tag on external rectal exam, weakened rectal tone, appreciated internal hemorrhoids, non-tender, no masses, , brown stool. Chaperone Denise Anoscopy: external skin tag, internal hemorrhoids Msk:  Symmetrical without gross deformities. Without edema, no deformity or joint abnormality.  Neurologic:  Alert and  oriented x4;  grossly normal neurologically.  Skin:   Dry and intact without significant lesions or rashes.  RELEVANT LABS AND IMAGING: CBC    Latest Ref Rng & Units 11/19/2024   12:13 PM 12/28/2020   12:27 PM  CBC  WBC 4.0 - 10.5 K/uL 8.8  6.4   Hemoglobin 13.0 - 17.0 g/dL 84.8  84.9   Hematocrit 39.0 - 52.0 % 42.3  43.4   Platelets 150.0 - 400.0 K/uL 195.0  199.0      CMP     Latest Ref Rng & Units 12/28/2020   12:27 PM 07/15/2019   11:49 AM 06/17/2019    2:38 PM  CMP  Glucose 70 - 99 mg/dL 886  896  891   BUN 6 - 23 mg/dL 12  16  14    Creatinine 0.40 - 1.50 mg/dL 8.58  8.52  8.61   Sodium 135 - 145 mEq/L 138  145  144   Potassium  3.5 - 5.1 mEq/L 3.4  4.2  3.8   Chloride 96 - 112 mEq/L 101  100  101  CO2 19 - 32 mEq/L 31  28  28    Calcium 8.4 - 10.5 mg/dL 9.3  9.5  9.5   Total Protein 6.0 - 8.3 g/dL 7.0  6.6    Total Bilirubin 0.2 - 1.2 mg/dL 2.8  2.0    Alkaline Phos 39 - 117 U/L 53  62    AST 0 - 37 U/L 16  15    ALT 0 - 53 U/L 17  14      06/2024 echo- Left ventricular ejection fraction, by estimation, is 50 to 55%.   Assessment/Plan: #1. Rectal bleeding #2 Internal hemorrhoids #3 Constipation, on oral iron -CBC, iron panel/TIBC -Calmol4 suppositories samples given -Anusol prescription 1 suppository at bedtime, RX sent  -Start OTC Miralax po daily -Continue Metamucil po daily #4 Hx of multiple tubular adenomas, last colon 12/2020 -Schedule for a colonoscopy in [facility] with Dr. Charlanne. The risks and benefits of colonoscopy with possible polypectomy / biopsies were discussed and the patient agrees to proceed.  -cardiac clearance for Edoxaban  #5. Hx of IDA. No recent labs. Has associated CKD, dCHF as cause of anemia of chronic disease.  -recheck iron/TIBC -continue iron supplement po daily  #6. Comorbid conditions include CAD s/p CABG, A. fib on savasya, HTN, dCHF (EF 55- 60%), CKD3, OSA, HLD  -on Edoxaban    Thank you for the courtesy of this consult. Please call me with any questions or concerns.   Gearald Stonebraker, FNP-C [facility] 11/19/2024, 3:42 PM  Cc: Erick Greig LABOR, NP

## 2024-11-23 ENCOUNTER — Ambulatory Visit: Payer: Self-pay | Admitting: Gastroenterology

## 2024-11-24 NOTE — Telephone Encounter (Signed)
° °  Patient Name: Jason Stark  DOB: November 08, 1946 MRN: [id number]  Primary Cardiologist: Redell Leiter, MD  Chart reviewed as part of pre-operative protocol coverage. Pre-op clearance already addressed by colleagues in earlier phone notes. To summarize recommendations:  -Per office protocol, patient can hold edoxaban  for 2 days prior to procedure.  Resume when medically safe to do so.  Medical clearance was not requested.   Will route this bundled recommendation to requesting provider via Epic fax function and remove from pre-op pool. Please call with questions.  Orren LOISE Fabry, PA-C 11/24/2024, 8:49 AM

## 2024-11-24 NOTE — Telephone Encounter (Signed)
 Patient with diagnosis of a fib on edoxaban  for anticoagulation.    Procedure: colonoscopy Date of procedure: 01/06/25   CHA2DS2-VASc Score = 5  This indicates a 7.2% annual risk of stroke. The patient's score is based upon: CHF History: 1 HTN History: 1 Diabetes History: 0 Stroke History: 0 Vascular Disease History: 1 Age Score: 2 Gender Score: 0     CrCl 77 m/min Platelet count 195K  Patient has not had an Afib/aflutter ablation in the last 3 months, DCCV within the last 4 weeks or a watchman implanted in the last 45 days    Per office protocol, patient can hold edoxaban  for 2 days prior to procedure.    **This guidance is not considered finalized until pre-operative APP has relayed final recommendations.**

## 2024-12-22 ENCOUNTER — Ambulatory Visit: Admitting: Podiatry

## 2024-12-22 DIAGNOSIS — B351 Tinea unguium: Secondary | ICD-10-CM

## 2024-12-22 DIAGNOSIS — L84 Corns and callosities: Secondary | ICD-10-CM

## 2024-12-22 DIAGNOSIS — I739 Peripheral vascular disease, unspecified: Secondary | ICD-10-CM

## 2024-12-22 DIAGNOSIS — G609 Hereditary and idiopathic neuropathy, unspecified: Secondary | ICD-10-CM

## 2024-12-22 MED ORDER — GABAPENTIN 100 MG PO CAPS: 100.0000 mg | ORAL_CAPSULE | Freq: Two times a day (BID) | ORAL | 4 refills | Status: AC

## 2024-12-22 NOTE — Progress Notes (Signed)
 "     Subjective:  Patient ID: Jason Stark, male    DOB: 17-Mar-1946,  MRN: [id number]  Jason Stark presents to clinic today for:  Chief Complaint  Patient presents with   [facility]    RFC with callus Not diabetic and takes Savays    Patient notes nails are thick and elongated, causing pain in shoe gear when ambulating.  Also has painful bilateral submet 5 calluses.  He notes that he is out of gabapentin  for his neuropathy.  He is requesting more.  He had previously stopped taking it due to side effects and we had sent in a compounded topical medication for peripheral neuropathy.  However, he specifically requested the gabapentin  to be refilled.  PCP is Moon, Amy A, NP.  Last seen 10/18/2024  Past Medical History:  Diagnosis Date   Abnormal myocardial perfusion study 03/11/2016   Overview:  Extensive lateral ischemia , [name] 44% in distribution of SVG to [facility] and M   Abnormal stress test 03/21/2016   Atrial fibrillation (HCC) 02/06/2021   Blood glucose elevated 08/27/2017   Cardiomegaly 08/27/2017   Cataract    Chronic diastolic heart failure (HCC) 06/13/2015   CKD (chronic kidney disease) stage 3, GFR 30-59 ml/min (HCC) 07/15/2019   COPD (chronic obstructive pulmonary disease) (HCC)    Coronary atherosclerosis of native coronary artery 06/13/2015   Overview:   Cardiac cath 03/13/16: Diagnostic Summary  Severe native multivessel disease as described below  Patent LIMA to LAD  Patent SVG to PDA  Occluded SVG to OM/OM2 ( jump graft)  Occluded SVG to Diagonal  Normal LV function  Diagnostic Recommendations  PCI to the Proximal LCX  Medical therapy for the other disease  Interventional Summary  Successful PCI to the proximaL LCX with Xience 3.0 x   Dyslipidemia 06/13/2015   Esophageal reflux 08/27/2017   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   High risk medication use 06/13/2015   Overview:  Sotolol   History of colon polyps    Hypertension 12/09/1988   Hypertensive heart disease with heart  failure (HCC) 06/13/2015   Kidney stone 02/06/2021   Long term current use of anticoagulant therapy 12/19/2015   Overview:   Edoxaban  started 12/14/15   OSA (obstructive sleep apnea) 08/27/2017   Osteoarthritis 08/27/2017   Permanent atrial fibrillation (HCC) 06/13/2015   Persistent atrial fibrillation (HCC) 06/13/2015   Preoperative cardiovascular examination 12/30/2018   Rheumatic disease of mitral valve 08/27/2017   Rheumatic heart failure (congestive) (HCC) 08/27/2017   SBO (small bowel obstruction) (HCC)    partial s/p x-lap due to adhesions (Dr Archer) followed by incisional hernia s/p repair.   Sleep apnea    Special screening for malignant neoplasms, colon 12/09/2008   Oct 24, 2009 Entered By: ILAH MORENE ORN Comment: 2 polyps repeat 2013   Status post total right knee replacement 03/08/2021   Allergies  Allergen Reactions   Kenalog -10 [Triamcinolone  Acetonide] Nausea And Vomiting    Confusion     Objective:  Jason Stark is a pleasant 79 y.o. male in NAD. AAO x 3.  Vascular Examination: Patient has palpable DP pulse, absent PT pulse bilateral.  Delayed capillary refill bilateral toes.  Sparse digital hair bilateral.  Proximal to distal cooling WNL bilateral.    Dermatological Examination: Interspaces are clear with no open lesions noted bilateral.  Skin is shiny and atrophic bilateral.  Nails are 3-40mm thick, with yellowish/brown discoloration, subungual debris and distal onycholysis x10.  There is pain with compression of nails x10.  There are  hyperkeratotic lesions noted bilateral submet 5.  Neurological examination: Protective sensation intact using a Semmes Weinstein monofilament.  Manual muscle testing 5/5 bilateral.  Patient qualifies for at-risk foot care because of PVD.  Assessment/Plan: 1. Pain due to onychomycosis of toenails of both feet   2. Callus of foot   3. PVD (peripheral vascular disease)   4. Idiopathic peripheral neuropathy     Meds  ordered this encounter  Medications   gabapentin  (NEURONTIN ) 100 MG capsule    Sig: Take 1 capsule (100 mg total) by mouth 2 (two) times daily.    Dispense:  60 capsule    Refill:  4   Mycotic nails x10 were sharply debrided with sterile nail nippers and power debriding burr to decrease bulk and length.  Hyperkeratotic lesions bilateral submet 5 were shaved with #312 blade.  Prescribed gabapentin  100 mg twice daily, number 60 tablets, with 4 refills, to his pharmacy on file.  Return in about 3 months (around 03/22/2025) for RFC.   Awanda CHARM Imperial, DPM, FACFAS [facility]     [address] [place], [place] [address]                Office [phone]  Fax [phone]  "

## 2025-01-06 ENCOUNTER — Encounter: Payer: Self-pay | Admitting: Gastroenterology

## 2025-01-06 ENCOUNTER — Ambulatory Visit: Admitting: Gastroenterology

## 2025-01-06 VITALS — BP 158/80 | HR 67 | Temp 97.2°F | Resp 12 | Ht 69.0 in | Wt 232.0 lb

## 2025-01-06 DIAGNOSIS — K648 Other hemorrhoids: Secondary | ICD-10-CM

## 2025-01-06 DIAGNOSIS — D124 Benign neoplasm of descending colon: Secondary | ICD-10-CM

## 2025-01-06 DIAGNOSIS — D509 Iron deficiency anemia, unspecified: Secondary | ICD-10-CM

## 2025-01-06 DIAGNOSIS — D123 Benign neoplasm of transverse colon: Secondary | ICD-10-CM

## 2025-01-06 DIAGNOSIS — D122 Benign neoplasm of ascending colon: Secondary | ICD-10-CM | POA: Diagnosis not present

## 2025-01-06 DIAGNOSIS — Z1211 Encounter for screening for malignant neoplasm of colon: Secondary | ICD-10-CM

## 2025-01-06 DIAGNOSIS — D120 Benign neoplasm of cecum: Secondary | ICD-10-CM

## 2025-01-06 DIAGNOSIS — K625 Hemorrhage of anus and rectum: Secondary | ICD-10-CM

## 2025-01-06 DIAGNOSIS — K5730 Diverticulosis of large intestine without perforation or abscess without bleeding: Secondary | ICD-10-CM | POA: Diagnosis not present

## 2025-01-06 DIAGNOSIS — Z860100 Personal history of colon polyps, unspecified: Secondary | ICD-10-CM

## 2025-01-06 MED ORDER — HYDROCORTISONE ACETATE 25 MG RE SUPP: 25.0000 mg | Freq: Two times a day (BID) | RECTAL | 6 refills | Status: AC

## 2025-01-06 MED ORDER — SODIUM CHLORIDE 0.9 % IV SOLN: 500.0000 mL | Freq: Once | INTRAVENOUS | Status: DC

## 2025-01-06 NOTE — Progress Notes (Signed)
  Gastroenterology History and Physical   Primary Care Physician:  Erick Greig LABOR, NP   Reason for Procedure:  History of polyps  Plan:    Colonoscopy   The patient was provided an opportunity to ask questions and all were answered. The patient agreed with the plan.   HPI: Jason Stark is a 79 y.o. male    Past Medical History:  Diagnosis Date   Abnormal myocardial perfusion study 03/11/2016   Overview:  Extensive lateral ischemia , [name] 44% in distribution of SVG to [name] and M   Abnormal stress test 03/21/2016   Atrial fibrillation (HCC) 02/06/2021   Blood glucose elevated 08/27/2017   Cardiomegaly 08/27/2017   Cataract    Chronic diastolic heart failure (HCC) 06/13/2015   CKD (chronic kidney disease) stage 3, GFR 30-59 ml/min (HCC) 07/15/2019   COPD (chronic obstructive pulmonary disease) (HCC)    Coronary atherosclerosis of native coronary artery 06/13/2015   Overview:   Cardiac cath 03/13/16: Diagnostic Summary  Severe native multivessel disease as described below  Patent LIMA to LAD  Patent SVG to PDA  Occluded SVG to OM/OM2 ( jump graft)  Occluded SVG to Diagonal  Normal LV function  Diagnostic Recommendations  PCI to the Proximal LCX  Medical therapy for the other disease  Interventional Summary  Successful PCI to the proximaL LCX with Xience 3.0 x   Dyslipidemia 06/13/2015   Esophageal reflux 08/27/2017   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   High risk medication use 06/13/2015   Overview:  Sotolol   History of colon polyps    Hypertension 12/09/1988   Hypertensive heart disease with heart failure (HCC) 06/13/2015   Kidney stone 02/06/2021   Long term current use of anticoagulant therapy 12/19/2015   Overview:   Edoxaban  started 12/14/15   OSA (obstructive sleep apnea) 08/27/2017   Osteoarthritis 08/27/2017   Permanent atrial fibrillation (HCC) 06/13/2015   Persistent atrial fibrillation (HCC) 06/13/2015   Preoperative cardiovascular examination 12/30/2018    Rheumatic disease of mitral valve 08/27/2017   Rheumatic heart failure (congestive) (HCC) 08/27/2017   SBO (small bowel obstruction) (HCC)    partial s/p x-lap due to adhesions (Dr Archer) followed by incisional hernia s/p repair.   Sleep apnea    Special screening for malignant neoplasms, colon 12/09/2008   Oct 24, 2009 Entered By: ILAH MORENE ORN Comment: 2 polyps repeat 2013   Status post total right knee replacement 03/08/2021    Past Surgical History:  Procedure Laterality Date   A FLUTTER ABLATION     a tach/flutter   CARDIAC SURGERY  2012   CARDIOVERSION  01/19/2016   CATARACT EXTRACTION     CHOLECYSTECTOMY     COLONOSCOPY W/ POLYPECTOMY  05/28/2017   Colonic polyp status post polypectomy. Moderate predominantly sigmoid diverticulosis. Small internal hemorrhoids (likely etiology of heme-positive stools- rule out other causes)   CORONARY ANGIOPLASTY WITH STENT PLACEMENT     CORONARY ARTERY BYPASS GRAFT  2011   with Maze and LAA ligation    CORONARY STENT PLACEMENT  03/2016   ESOPHAGOGASTRODUODENOSCOPY  07/23/2017   Mild gastritis. Small transient hiatal hernia. Otherwise normal EGD   HERNIA REPAIR Left    side   INGUINAL HERNIA REPAIR Right    KNEE SURGERY     PROSTATE SURGERY     RECTAL SURGERY     TOTAL KNEE ARTHROPLASTY Right 02/2019    Prior to Admission medications  Medication Sig Start Date End Date Taking? Authorizing Provider  amLODipine-benazepril (LOTREL) 5-20 MG capsule Take  1 capsule by mouth 2 (two) times daily.  02/27/17  Yes [provider]  Cholecalciferol (VITAMIN D-3) 125 MCG (5000 UT) TABS Take 1 tablet by mouth daily.   Yes [provider]  famotidine (PEPCID) 20 MG tablet Take 20 mg by mouth daily. 10/12/23  Yes [provider]  gabapentin  (NEURONTIN ) 100 MG capsule Take 1 capsule (100 mg total) by mouth 2 (two) times daily. 12/22/24  Yes McCaughan, Dia D, DPM  loratadine (CLARITIN) 10 MG tablet Take 10 mg by mouth  daily.    Yes [provider]  metoprolol  tartrate (LOPRESSOR ) 25 MG tablet Take 1 tablet (25 mg total) by mouth daily. 09/30/24  Yes Monetta Redell PARAS, MD  montelukast (SINGULAIR) 10 MG tablet Take 10 mg by mouth daily. 10/11/23  Yes [provider]  potassium chloride  (KLOR-CON ) 10 MEQ tablet Take 1 tablet (10 mEq total) by mouth daily. 09/08/24  Yes Madireddy, Alean SAUNDERS, MD  pravastatin  (PRAVACHOL ) 40 MG tablet Take 1 tablet (40 mg total) by mouth daily. 01/14/20  Yes Monetta Redell PARAS, MD  tamsulosin (FLOMAX) 0.4 MG CAPS capsule Take 0.4 mg by mouth daily. 07/17/20  Yes [provider]  torsemide  (DEMADEX ) 20 MG tablet Take 20 mg by mouth daily. 05/02/22  Yes [provider]  acetaminophen (TYLENOL) 650 MG CR tablet Take 1,300 mg by mouth 2 (two) times daily as needed for pain.    [provider]  Docusate Calcium (STOOL SOFTENER PO) Take 1 capsule by mouth daily as needed (constipation).    [provider]  edoxaban  (SAVAYSA ) 60 MG TABS tablet TAKE 1 TABLET DAILY 11/01/24   Monetta Redell PARAS, MD  ezetimibe  (ZETIA ) 10 MG tablet Take 10 mg by mouth daily.    [provider]  ferrous sulfate 325 (65 FE) MG tablet Take 324 mg by mouth daily. 07/03/10   [provider]  hydrocortisone  (ANUSOL -HC) 25 MG suppository Place 1 suppository (25 mg total) rectally at bedtime. Patient not taking: Reported on 01/06/2025 11/19/24   May, Deanna J, NP  NONFORMULARY OR COMPOUNDED ITEM Apply 1 Application topically 3 (three) times daily. 09/30/24   McCaughan, Dia D, DPM    Current Outpatient Medications  Medication Sig Dispense Refill   amLODipine-benazepril (LOTREL) 5-20 MG capsule Take 1 capsule by mouth 2 (two) times daily.      Cholecalciferol (VITAMIN D-3) 125 MCG (5000 UT) TABS Take 1 tablet by mouth daily.     famotidine (PEPCID) 20 MG tablet Take 20 mg by mouth daily.     gabapentin  (NEURONTIN ) 100 MG capsule Take 1 capsule (100 mg total) by  mouth 2 (two) times daily. 60 capsule 4   loratadine (CLARITIN) 10 MG tablet Take 10 mg by mouth daily.      metoprolol  tartrate (LOPRESSOR ) 25 MG tablet Take 1 tablet (25 mg total) by mouth daily. 90 tablet 2   montelukast (SINGULAIR) 10 MG tablet Take 10 mg by mouth daily.     potassium chloride  (KLOR-CON ) 10 MEQ tablet Take 1 tablet (10 mEq total) by mouth daily. 90 tablet 2   pravastatin  (PRAVACHOL ) 40 MG tablet Take 1 tablet (40 mg total) by mouth daily. 90 tablet 3   tamsulosin (FLOMAX) 0.4 MG CAPS capsule Take 0.4 mg by mouth daily.     torsemide  (DEMADEX ) 20 MG tablet Take 20 mg by mouth daily.     acetaminophen (TYLENOL) 650 MG CR tablet Take 1,300 mg by mouth 2 (two) times daily as needed for  pain.     Docusate Calcium (STOOL SOFTENER PO) Take 1 capsule by mouth daily as needed (constipation).     edoxaban  (SAVAYSA ) 60 MG TABS tablet TAKE 1 TABLET DAILY 90 tablet 2   ezetimibe  (ZETIA ) 10 MG tablet Take 10 mg by mouth daily.     ferrous sulfate 325 (65 FE) MG tablet Take 324 mg by mouth daily.     hydrocortisone  (ANUSOL -HC) 25 MG suppository Place 1 suppository (25 mg total) rectally at bedtime. (Patient not taking: Reported on 01/06/2025) 10 suppository 0   NONFORMULARY OR COMPOUNDED ITEM Apply 1 Application topically 3 (three) times daily. 60 each 4   Current Facility-Administered Medications  Medication Dose Route Frequency Provider Last Rate Last Admin   0.9 %  sodium chloride  infusion  500 mL Intravenous Once Charlanne Groom, MD        Allergies as of 01/06/2025 - Review Complete 01/06/2025  Allergen Reaction Noted   Kenalog -10 [triamcinolone  acetonide] Nausea And Vomiting 11/19/2024    Family History  Problem Relation Age of Onset   Diabetes Mother    Hyperlipidemia Mother    Colon polyps Mother    Stroke Father    Heart attack Sister    Diabetes Sister    Cancer Sister    Heart attack Sister    Hypertension Brother    Hyperlipidemia Brother    Stroke Paternal  Grandmother    Hypertension Daughter    Hypertension Son    Colon cancer Neg Hx    Esophageal cancer Neg Hx    Stomach cancer Neg Hx    Rectal cancer Neg Hx     Social History   Socioeconomic History   Marital status: Married    Spouse name: Not on file   Number of children: 2   Years of education: Not on file   Highest education level: Not on file  Occupational History   Not on file  Tobacco Use   Smoking status: Former    Current packs/day: 0.00    Average packs/day: 0.3 packs/day for 25.0 years (6.3 ttl pk-yrs)    Types: Cigarettes    Start date: 21    Quit date: 2010    Years since quitting: 16.0    Passive exposure: Past   Smokeless tobacco: Former    Types: Chew    Quit date: 2011  Vaping Use   Vaping status: Never Used  Substance and Sexual Activity   Alcohol use: No   Drug use: No   Sexual activity: Not on file  Other Topics Concern   Not on file  Social History Narrative   Not on file   Social Drivers of Health   Tobacco Use: Medium Risk (01/06/2025)   Patient History    Smoking Tobacco Use: Former    Smokeless Tobacco Use: Former    Passive Exposure: Past  Programmer, Applications: Not on Ship Broker Insecurity: Not on Chartered Certified Accountant Needs: Not on file  Physical Activity: Not on file  Stress: Not on file  Social Connections: Not on file  Intimate Partner Violence: Not on file  Depression (PHQ2-9): Not on file  Alcohol Screen: Not on file  Housing: Not on file  Utilities: Not on file  Health Literacy: Not on file    Review of Systems: Positive for none All other review of systems negative except as mentioned in the HPI.  Physical Exam: Vital signs in last 24 hours: @VSRANGES @   General:   Alert,  Well-developed, well-nourished,  pleasant and cooperative in NAD Lungs:  Clear throughout to auscultation.   Heart:  Regular rate and rhythm; no murmurs, clicks, rubs,  or gallops. Abdomen:  Soft, nontender and nondistended. Normal  bowel sounds.   Neuro/Psych:  Alert and cooperative. Normal mood and affect. A and O x 3    No significant changes were identified.  The patient continues to be an appropriate candidate for the planned procedure and anesthesia.   Anselm Bring, MD. [facility] Gastroenterology 01/06/2025 11:09 AM@

## 2025-01-06 NOTE — Progress Notes (Signed)
 Called to room to assist during endoscopic procedure.  Patient ID and intended procedure confirmed with present staff. Received instructions for my participation in the procedure from the performing physician.

## 2025-01-06 NOTE — Op Note (Signed)
 [facility] Patient Name: Jason Stark Procedure Date: 01/06/2025 11:06 AM MRN: [id number] Endoscopist: Lynnie Bring , MD, [id number] Age: 79 Referring MD:  Date of Birth: 08-29-1946 Gender: Male Account #: [id number] Procedure:                Colonoscopy Indications:              High risk colon cancer surveillance: Personal                            history of colonic polyps. IDA-resolved Medicines:                Monitored Anesthesia Care Procedure:                Pre-Anesthesia Assessment:                           - Prior to the procedure, a History and Physical                            was performed, and patient medications and                            allergies were reviewed. The patient's tolerance of                            previous anesthesia was also reviewed. The risks                            and benefits of the procedure and the sedation                            options and risks were discussed with the patient.                            All questions were answered, and informed consent                            was obtained. Prior Anticoagulants: The patient has                            taken Savaysa  (edoxaban ), last dose was 3 days                            prior to procedure. ASA Grade Assessment: II - A                            patient with mild systemic disease. After reviewing                            the risks and benefits, the patient was deemed in                            satisfactory condition to undergo the procedure.  After obtaining informed consent, the colonoscope                            was passed under direct vision. Throughout the                            procedure, the patient's blood pressure, pulse, and                            oxygen  saturations were monitored continuously. The                            Olympus Scope CF SN [id number] was introduced through                             the anus and advanced to the 2 cm into the ileum.                            The colonoscopy was performed without difficulty.                            The patient tolerated the procedure well. The                            quality of the bowel preparation was good. The                            terminal ileum, ileocecal valve, appendiceal                            orifice, and rectum were photographed. Scope In: 11:16:35 AM Scope Out: 11:35:31 AM Scope Withdrawal Time: 0 hours 13 minutes 17 seconds  Total Procedure Duration: 0 hours 18 minutes 56 seconds  Findings:                 Four sessile polyps were found in the proximal                            transverse colon, proximal ascending colon, mid                            ascending colon and cecum. The polyps were 2 to 3                            mm in size. These polyps were removed with a cold                            snare. Resection and retrieval were complete.                           A 4 mm polyp was found in the mid descending colon.  The polyp was sessile. The polyp was removed with a                            cold snare. Resection and retrieval were complete.                           Multiple medium-mouthed diverticula were found in                            the sigmoid colon and descending colon. Rare in asc                            colon.                           Non-bleeding internal hemorrhoids were found during                            retroflexion and during perianal exam. The                            hemorrhoids were moderate.                           The terminal ileum appeared normal.                           The exam was otherwise without abnormality on                            direct and retroflexion views. Complications:            No immediate complications. Estimated Blood Loss:     Estimated blood loss: none. Impression:               - Four 2 to 3 mm polyps  in the proximal transverse                            colon, in the proximal ascending colon, in the mid                            ascending colon and in the cecum, removed with a                            cold snare. Resected and retrieved.                           - One 4 mm polyp in the mid descending colon,                            removed with a cold snare. Resected and retrieved.                           - Diverticulosis in the sigmoid colon and in the  descending colon.                           - Non-bleeding internal hemorrhoids.                           - The examined portion of the ileum was normal.                           - The examination was otherwise normal on direct                            and retroflexion views. Recommendation:           - Patient has a contact number available for                            emergencies. The signs and symptoms of potential                            delayed complications were discussed with the                            patient. Return to normal activities tomorrow.                            Written discharge instructions were provided to the                            patient.                           - Resume previous diet.                           - Continue present medications.                           - Await pathology results.                           - Repeat colonoscopy for surveillance based on                            pathology results.                           - If any problems with hemorrhoids, use Preparation                            H suppositories 1 twice daily after the bowel meant                            for 7 to 10 days.                           - Resume Savaysa  (edoxaban ) at prior dose 1/31.                           -  The findings and recommendations were discussed                            with the patient's family. Lynnie Bring, MD 01/06/2025 11:48:06 AM This  report has been signed electronically.

## 2025-01-06 NOTE — Progress Notes (Signed)
 Report given to PACU, vss

## 2025-01-06 NOTE — Patient Instructions (Addendum)
 Resume previous diet.  Continue present medications.  Await pathology results.   Repeat colonoscopy for surveillance based on pathology results.   Prescription suppositories called in to pharmacy, take one suppository twice a day after bowel movements. Use over the counter Miralax to soften stools and prevent straining.   Resume Savaysa  (edoxaban ) at prior dose on Saturday, 1/31.   YOU HAD AN ENDOSCOPIC PROCEDURE TODAY AT THE [facility]:   Refer to the procedure report that was given to you for any specific questions about what was found during the examination.  If the procedure report does not answer your questions, please call your gastroenterologist to clarify.  If you requested that your care partner not be given the details of your procedure findings, then the procedure report has been included in a sealed envelope for you to review at your convenience later.  YOU SHOULD EXPECT: Some feelings of bloating in the abdomen. Passage of more gas than usual.  Walking can help get rid of the air that was put into your GI tract during the procedure and reduce the bloating. If you had a lower endoscopy (such as a colonoscopy or flexible sigmoidoscopy) you may notice spotting of blood in your stool or on the toilet paper. If you underwent a bowel prep for your procedure, you may not have a normal bowel movement for a few days.  Please Note:  You might notice some irritation and congestion in your nose or some drainage.  This is from the oxygen  used during your procedure.  There is no need for concern and it should clear up in a day or so.  SYMPTOMS TO REPORT IMMEDIATELY:  Following lower endoscopy (colonoscopy or flexible sigmoidoscopy):  Excessive amounts of blood in the stool  Significant tenderness or worsening of abdominal pains  Swelling of the abdomen that is new, acute  Fever of 100F or higher  For urgent or emergent issues, a gastroenterologist can be reached at any hour by  calling [phone]. Do not use MyChart messaging for urgent concerns.    DIET:  We do recommend a small meal at first, but then you may proceed to your regular diet.  Drink plenty of fluids but you should avoid alcoholic beverages for 24 hours.  ACTIVITY:  You should plan to take it easy for the rest of today and you should NOT DRIVE or use heavy machinery until tomorrow (because of the sedation medicines used during the test).    FOLLOW UP: Our staff will call the number listed on your records the next business day following your procedure.  We will call around 7:15- 8:00 am to check on you and address any questions or concerns that you may have regarding the information given to you following your procedure. If we do not reach you, we will leave a message.     If any biopsies were taken you will be contacted by phone or by letter within the next 1-3 weeks.  Please call us  at [phone] if you have not heard about the biopsies in 3 weeks.    SIGNATURES/CONFIDENTIALITY: You and/or your care partner have signed paperwork which will be entered into your electronic medical record.  These signatures attest to the fact that that the information above on your After Visit Summary has been reviewed and is understood.  Full responsibility of the confidentiality of this discharge information lies with you and/or your care-partner.

## 2025-01-07 ENCOUNTER — Telehealth: Payer: Self-pay | Admitting: *Deleted

## 2025-01-07 NOTE — Telephone Encounter (Signed)
" °  Follow up Call-     01/06/2025   10:35 AM  Call back number  Post procedure Call Back phone  # [phone]  Permission to leave phone message Yes     Patient questions:  Do you have a fever, pain , or abdominal swelling? No. Pain Score  0 *  Have you tolerated food without any problems? Yes.    Have you been able to return to your normal activities? Yes.    Do you have any questions about your discharge instructions: Diet   No. Medications  No. Follow up visit  No.  Do you have questions or concerns about your Care? No.  Actions: * If pain score is 4 or above: No action needed, pain <4.   "

## 2025-01-10 LAB — SURGICAL PATHOLOGY

## 2025-03-23 ENCOUNTER — Ambulatory Visit: Admitting: Podiatry
